# Patient Record
Sex: Female | Born: 1965 | Race: White | Hispanic: No | Marital: Single | Smoking: Never smoker
Health system: Southern US, Community
[De-identification: ages and names within clinical notes are randomized; demographics above are authoritative.]

## PROBLEM LIST (undated history)

## (undated) DIAGNOSIS — T07XXXA Unspecified multiple injuries, initial encounter: Secondary | ICD-10-CM

## (undated) DIAGNOSIS — R011 Cardiac murmur, unspecified: Secondary | ICD-10-CM

## (undated) DIAGNOSIS — L709 Acne, unspecified: Secondary | ICD-10-CM

## (undated) HISTORY — DX: Unspecified multiple injuries, initial encounter: T07.XXXA

## (undated) HISTORY — DX: Cardiac murmur, unspecified: R01.1

## (undated) HISTORY — DX: Acne, unspecified: L70.9

---

## 1993-02-28 HISTORY — PX: TUBAL LIGATION: SHX77

## 2005-08-06 ENCOUNTER — Emergency Department (HOSPITAL_COMMUNITY): Admission: EM | Admit: 2005-08-06 | Discharge: 2005-08-06 | Payer: Self-pay | Admitting: Emergency Medicine

## 2005-08-17 ENCOUNTER — Other Ambulatory Visit: Admission: RE | Admit: 2005-08-17 | Discharge: 2005-08-17 | Payer: Self-pay | Admitting: Obstetrics and Gynecology

## 2005-10-15 ENCOUNTER — Emergency Department (HOSPITAL_COMMUNITY): Admission: EM | Admit: 2005-10-15 | Discharge: 2005-10-15 | Payer: Self-pay | Admitting: Emergency Medicine

## 2006-10-16 ENCOUNTER — Other Ambulatory Visit: Admission: RE | Admit: 2006-10-16 | Discharge: 2006-10-16 | Payer: Self-pay | Admitting: Gynecology

## 2007-07-30 ENCOUNTER — Other Ambulatory Visit: Admission: RE | Admit: 2007-07-30 | Discharge: 2007-07-30 | Payer: Self-pay | Admitting: Gynecology

## 2008-11-09 ENCOUNTER — Emergency Department (HOSPITAL_COMMUNITY): Admission: EM | Admit: 2008-11-09 | Discharge: 2008-11-09 | Payer: Self-pay | Admitting: Emergency Medicine

## 2009-04-09 ENCOUNTER — Other Ambulatory Visit: Admission: RE | Admit: 2009-04-09 | Discharge: 2009-04-09 | Payer: Self-pay | Admitting: Gynecology

## 2009-04-09 ENCOUNTER — Ambulatory Visit: Payer: Self-pay | Admitting: Women's Health

## 2009-10-07 ENCOUNTER — Emergency Department (HOSPITAL_COMMUNITY): Admission: EM | Admit: 2009-10-07 | Discharge: 2009-10-07 | Payer: Self-pay | Admitting: Emergency Medicine

## 2009-10-30 ENCOUNTER — Ambulatory Visit: Payer: Self-pay | Admitting: Women's Health

## 2010-05-14 LAB — URINALYSIS, ROUTINE W REFLEX MICROSCOPIC
Ketones, ur: 15 mg/dL — AB
Protein, ur: 100 mg/dL — AB
Urobilinogen, UA: 0.2 mg/dL (ref 0.0–1.0)

## 2010-05-14 LAB — URINE MICROSCOPIC-ADD ON

## 2010-05-14 LAB — PREGNANCY, URINE: Preg Test, Ur: NEGATIVE

## 2010-10-01 ENCOUNTER — Encounter: Payer: Self-pay | Admitting: Women's Health

## 2010-10-01 ENCOUNTER — Other Ambulatory Visit (HOSPITAL_COMMUNITY)
Admission: RE | Admit: 2010-10-01 | Discharge: 2010-10-01 | Disposition: A | Discharge status: Home / Self Care | Payer: BC Managed Care – PPO | Source: Ambulatory Visit | Attending: Women's Health | Admitting: Women's Health

## 2010-10-01 ENCOUNTER — Ambulatory Visit (INDEPENDENT_AMBULATORY_CARE_PROVIDER_SITE_OTHER): Payer: BC Managed Care – PPO | Admitting: Women's Health

## 2010-10-01 VITALS — BP 100/60 | Ht 71.5 in | Wt 157.0 lb

## 2010-10-01 DIAGNOSIS — R823 Hemoglobinuria: Secondary | ICD-10-CM

## 2010-10-01 DIAGNOSIS — Z01419 Encounter for gynecological examination (general) (routine) without abnormal findings: Secondary | ICD-10-CM

## 2010-10-01 DIAGNOSIS — Z833 Family history of diabetes mellitus: Secondary | ICD-10-CM

## 2010-10-01 NOTE — Progress Notes (Signed)
Diana Vazquez 1965/04/29 [id number]    History:    The patient presents for annual exam.  45 year old single female with monthly 4 day cycles without problems/ BTL. She is in a new relationship, and has had a negative STD screening. She has occasional urinary frequency.   Past medical history, past surgical history, family history and social history were all reviewed and documented in the EPIC chart.   ROS:  A 14 point ROS was performed and pertinent positives and negatives are included in the history.  Exam:  Filed Vitals:   10/01/10 1357  BP: 100/60    General appearance:  Normal Head/Neck:  Normal, without cervical or supraclavicular adenopathy. Thyroid:  Symmetrical, normal in size, without palpable masses or nodularity. Respiratory  Effort:  Normal  Auscultation:  Clear without wheezing or rhonchi Cardiovascular  Auscultation:  Regular rate, without rubs, murmurs or gallops  Edema/varicosities:  Not grossly evident Abdominal  Masses/tenderness:  Soft,nontender, without masses, guarding or rebound.  Liver/spleen:  No organomegaly noted  Hernia:  None appreciated  Occult test:   Skin  Inspection:  Grossly normal  Palpation:  Grossly normal Neurologic/psychiatric  Orientation:  Normal with appropriate conversation.  Mood/affect:  Normal  Genitourinary    Breasts: Examined lying and sitting.     Right: Without masses, retractions, discharge or axillary adenopathy.     Left: Without masses, retractions, discharge or axillary adenopathy.   Inguinal/mons:  Normal without inguinal adenopathy  External genitalia:  Normal  BUS/Urethra/Skene's glands:  Normal  Bladder:  Normal  Vagina:  Normal  Cervix:  normal  Uterus:  normal in size, shape and contour.  Midline and mobile  Adnexa/parametria:     Rt: Without masses or tenderness.   Lt: Without masses or tenderness.  Anus and perineum: Normal  Digital rectal exam: Normal sphincter tone without palpated masses or  tenderness  Assessment/Plan:  45 y.o. year old female for annual exam.  SBE is, exercise, multivitamin daily encouraged. She does have a healthy lifestyle and will continue. Will check a CBC glucose UA and Pap. She had a normal mammogram in 2009, has not had one since. Reviewed the importance of an annual screening after age 63. Breast center number was given she will get scheduled. UTI prevention discussed. Encouraged increase fluids and cranberry juice daily. One year recall her as needed    Harrington Challenger MD, 2:40 PM 10/01/2010

## 2010-10-04 ENCOUNTER — Telehealth: Payer: Self-pay | Admitting: Women's Health

## 2010-10-04 NOTE — Telephone Encounter (Signed)
Telephone call to review labs. Random glucose was 116, her mother is a diabetic. Did review importance of exercise, decreasing simple sugars in her diet. Hemoglobin was 12.1 and hematocrit was 34.9 did review hematocrit slightly low. Encouraged to take a multivitamin with iron, and increase iron rich foods. States has always had  some problems with anemia. Instructed to check a fasting glucose at her mothers home and call with results.

## 2011-02-14 ENCOUNTER — Other Ambulatory Visit: Payer: Self-pay | Admitting: *Deleted

## 2011-02-14 MED ORDER — ACYCLOVIR 200 MG PO CAPS: 200.0000 mg | ORAL_CAPSULE | Freq: Four times a day (QID) | ORAL | Status: AC | PRN

## 2011-07-29 ENCOUNTER — Other Ambulatory Visit: Payer: Self-pay | Admitting: *Deleted

## 2011-07-29 MED ORDER — ACYCLOVIR 200 MG PO CAPS: 200.0000 mg | ORAL_CAPSULE | ORAL | Status: AC

## 2011-12-05 ENCOUNTER — Encounter (INDEPENDENT_AMBULATORY_CARE_PROVIDER_SITE_OTHER): Payer: Self-pay | Admitting: Internal Medicine

## 2011-12-05 ENCOUNTER — Ambulatory Visit (INDEPENDENT_AMBULATORY_CARE_PROVIDER_SITE_OTHER): Payer: No Typology Code available for payment source | Admitting: Internal Medicine

## 2011-12-05 VITALS — BP 110/70 | HR 63 | Temp 97.7°F | Ht 71.0 in | Wt 154.4 lb

## 2011-12-05 NOTE — Progress Notes (Signed)
Subjective:       Patient ID: Danielle Harrell is a 46 y.o. female.    HPI    Danielle Harrell is a 46 y.o. female presenting today as a new patient.  She is new to me and to the practice.  She presents today for an annual physical.  She states that she moved here from [facility] and wanted to establish care. She has not had any blood work for a while and would like to do that.     She states that she has been feeling that her her left eyebrow has been thinning and was hoping to see someone for that.  Other than that, she has been doing well overall and has no acute complaints.  Denies chest pain, nausea, vomiting, fever or chills.      She states that she does her pap smears and mammograms with her GYN.      Past Medical History   Diagnosis Date   . Fractures      History reviewed. No pertinent past surgical history.  Family History   Problem Relation Age of Onset   . Diabetes Mother    . Kidney disease Paternal Grandmother    . Diabetes Paternal Grandmother      History     Social History   . Marital Status: Single     Spouse Name: N/A     Number of Children: N/A   . Years of Education: N/A     Occupational History   . Not on file.     Social History Main Topics   . Smoking status: Never Smoker    . Smokeless tobacco: Not on file   . Alcohol Use: No   . Drug Use: No   . Sexually Active: No     Other Topics Concern   . Not on file     Social History Narrative   . No narrative on file     No Known Allergies  No current outpatient prescriptions on file.           Review of Systems   Constitutional: Negative for fever, chills, diaphoresis, appetite change, fatigue and unexpected weight change.   HENT: Negative for ear pain, congestion, sore throat, rhinorrhea, trouble swallowing, neck pain, postnasal drip, sinus pressure, tinnitus and ear discharge.    Eyes: Negative for photophobia, pain, redness and visual disturbance.   Respiratory: Negative for cough, choking, chest tightness, shortness of breath, wheezing and  stridor.    Cardiovascular: Negative for chest pain, palpitations and leg swelling.   Gastrointestinal: Negative for nausea, vomiting, abdominal pain, diarrhea, blood in stool, abdominal distention and anal bleeding.   Genitourinary: Negative for dysuria, urgency, frequency, hematuria, flank pain and vaginal pain.   Musculoskeletal: Negative for back pain, joint swelling and gait problem.   Skin: Negative for pallor and rash.        Hair loss of left eyebrow   Neurological: Negative for tremors, seizures, syncope, facial asymmetry, speech difficulty, weakness, light-headedness, numbness and headaches.   Psychiatric/Behavioral: Negative for suicidal ideas, hallucinations, behavioral problems, confusion, sleep disturbance, self-injury, dysphoric mood, decreased concentration and agitation. The patient is not nervous/anxious and is not hyperactive.            Objective:    Physical Exam   Constitutional: She is oriented to person, place, and time. She appears well-developed and well-nourished. No distress.   HENT:   Right Ear: External ear normal.   Left Ear: External ear normal.  Mouth/Throat: No oropharyngeal exudate.   Eyes: Conjunctivae normal and EOM are normal. Right eye exhibits no discharge. Left eye exhibits no discharge. No scleral icterus.   Neck: Normal range of motion. Neck supple.   Cardiovascular: Normal rate and regular rhythm.  Exam reveals no gallop.    No murmur heard.  Pulmonary/Chest: Effort normal and breath sounds normal.   Abdominal: Soft. Bowel sounds are normal. She exhibits no distension and no mass. There is no tenderness. There is no rebound and no guarding.   Musculoskeletal: Normal range of motion.   Neurological: She is alert and oriented to person, place, and time.   Skin: Skin is warm. No rash noted. She is not diaphoretic. No erythema.        Hair loss of left eyebrow  Generalized skin lesions   Psychiatric: She has a normal mood and affect. Her behavior is normal. Judgment and  thought content normal.           Assessment:         Danielle Harrell was seen today for annual exam.    Diagnoses and associated orders for this visit:    Routine general medical examination at a health care facility:  Overall doing well and will check the following.  - Basic Metabolic Panel  - CBC without differential  - TSH  - Lipid panel  - Hepatic function panel (LFT)    Hair loss mainly of left eyebrow: Check TSH and refer to dermatology    Generalized skin lesions:  Refer to dermatology     Lifestyle    Dental: 4 months ago, another one today    ,  Floss: yes       Exercise habits: 30 minutes a day     Caffeine intake: coffee a day.      Immunizations.    Tdap. Less than 7 years ago      Preventative: per patient she likes to do it with her GYN.      PAP SMEAR:  06/2010, appt next week with gyn  MAMMOGRAM: will do it with her GYN.  Monthly breast exam:  Will start.      Body mass index is 21.53 kg/(m^2).  Discussed the patient's BMI with her and advised on healthy lifestyle and nutritional guidelines.    Patient is alert and oriented x 3. Understands risks and benefits of current medications and management.  Accepts the current medications and management with and possible risks.  Patient asked appropriate questions which were answered.  She knows she can call if any questions or concerns.

## 2011-12-05 NOTE — Patient Instructions (Signed)
Please see Dermatologist    Dr. Ainsley Spinner or Dr. Les Pou.    [address]  [place] [address]  [phone]

## 2011-12-06 LAB — LIPID PANEL
Cholesterol / HDL Ratio: 3.2 ratio units (ref 0.0–4.4)
Cholesterol: 149 mg/dL (ref 100–199)
HDL: 46 mg/dL (ref >39)
LDL Calculated: 94 mg/dL (ref 0–99)
Triglycerides: 47 mg/dL (ref 0–149)
VLDL Calculated: 9 mg/dL (ref 5–40)

## 2011-12-06 LAB — BASIC METABOLIC PANEL
BUN / Creatinine Ratio: 14 (ref 9–23)
BUN: 12 mg/dL (ref 6–24)
CO2: 20 mmol/L (ref 19–28)
Calcium: 8.8 mg/dL (ref 8.7–10.2)
Chloride: 104 mmol/L (ref 97–108)
Creatinine: 0.87 mg/dL (ref 0.57–1.00)
EGFR: 82 mL/min/{1.73_m2} (ref >59)
EGFR: 94 mL/min/{1.73_m2} (ref >59)
Glucose: 65 mg/dL (ref 65–99)
Potassium: 4 mmol/L (ref 3.5–5.2)
Sodium: 139 mmol/L (ref 134–144)

## 2011-12-06 LAB — HEPATIC FUNCTION PANEL
ALT: 13 IU/L (ref 0–32)
AST (SGOT): 17 IU/L (ref 0–40)
Albumin: 4.2 g/dL (ref 3.5–5.5)
Alkaline Phosphatase: 67 IU/L (ref 42–107)
Bilirubin Direct: 0.13 mg/dL (ref 0.00–0.40)
Bilirubin, Total: 0.6 mg/dL (ref 0.0–1.2)
Protein, Total: 7.2 g/dL (ref 6.0–8.5)

## 2011-12-06 LAB — CBC
Hematocrit: 36.9 % (ref 34.0–46.6)
Hemoglobin: 12.2 g/dL (ref 11.1–15.9)
MCH: 31.5 pg (ref 26.6–33.0)
MCHC: 33.1 g/dL (ref 31.5–35.7)
MCV: 95 fL (ref 79–97)
Platelets: 221 10*3/uL (ref 155–379)
RBC: 3.87 x10E6/uL (ref 3.77–5.28)
RDW: 13.6 % (ref 12.3–15.4)
WBC: 6.6 10*3/uL (ref 3.4–10.8)

## 2011-12-06 LAB — TSH: TSH: 3.51 u[IU]/mL (ref 0.450–4.500)

## 2011-12-12 ENCOUNTER — Encounter: Payer: BC Managed Care – PPO | Admitting: Women's Health

## 2011-12-14 ENCOUNTER — Ambulatory Visit (INDEPENDENT_AMBULATORY_CARE_PROVIDER_SITE_OTHER): Payer: 59 | Admitting: Women's Health

## 2011-12-14 ENCOUNTER — Encounter: Payer: Self-pay | Admitting: Women's Health

## 2011-12-14 VITALS — BP 120/78 | Ht 71.0 in | Wt 157.0 lb

## 2011-12-14 DIAGNOSIS — Z01419 Encounter for gynecological examination (general) (routine) without abnormal findings: Secondary | ICD-10-CM

## 2011-12-14 DIAGNOSIS — B009 Herpesviral infection, unspecified: Secondary | ICD-10-CM

## 2011-12-14 DIAGNOSIS — A609 Anogenital herpesviral infection, unspecified: Secondary | ICD-10-CM

## 2011-12-14 MED ORDER — ACYCLOVIR 200 MG PO CAPS: 200.0000 mg | ORAL_CAPSULE | ORAL | Status: DC

## 2011-12-14 NOTE — Patient Instructions (Addendum)
Vit D 1000 daily Schedule mammogram Health Maintenance, Females A healthy lifestyle and preventative care can promote health and wellness.  Maintain regular health, dental, and eye exams.  Eat a healthy diet. Foods like vegetables, fruits, whole grains, low-fat dairy products, and lean protein foods contain the nutrients you need without too many calories. Decrease your intake of foods high in solid fats, added sugars, and salt. Get information about a proper diet from your caregiver, if necessary.  Regular physical exercise is one of the most important things you can do for your health. Most adults should get at least 150 minutes of moderate-intensity exercise (any activity that increases your heart rate and causes you to sweat) each week. In addition, most adults need muscle-strengthening exercises on 2 or more days a week.   Maintain a healthy weight. The body mass index (BMI) is a screening tool to identify possible weight problems. It provides an estimate of body fat based on height and weight. Your caregiver can help determine your BMI, and can help you achieve or maintain a healthy weight. For adults 20 years and older:  A BMI below 18.5 is considered underweight.  A BMI of 18.5 to 24.9 is normal.  A BMI of 25 to 29.9 is considered overweight.  A BMI of 30 and above is considered obese.  Maintain normal blood lipids and cholesterol by exercising and minimizing your intake of saturated fat. Eat a balanced diet with plenty of fruits and vegetables. Blood tests for lipids and cholesterol should begin at age 86 and be repeated every 5 years. If your lipid or cholesterol levels are high, you are over 50, or you are a high risk for heart disease, you may need your cholesterol levels checked more frequently.Ongoing high lipid and cholesterol levels should be treated with medicines if diet and exercise are not effective.  If you smoke, find out from your caregiver how to quit. If you do not  use tobacco, do not start.  If you are pregnant, do not drink alcohol. If you are breastfeeding, be very cautious about drinking alcohol. If you are not pregnant and choose to drink alcohol, do not exceed 1 drink per day. One drink is considered to be 12 ounces (355 mL) of beer, 5 ounces (148 mL) of wine, or 1.5 ounces (44 mL) of liquor.  Avoid use of street drugs. Do not share needles with anyone. Ask for help if you need support or instructions about stopping the use of drugs.  High blood pressure causes heart disease and increases the risk of stroke. Blood pressure should be checked at least every 1 to 2 years. Ongoing high blood pressure should be treated with medicines, if weight loss and exercise are not effective.  If you are 68 to 44 years old, ask your caregiver if you should take aspirin to prevent strokes.  Diabetes screening involves taking a blood sample to check your fasting blood sugar level. This should be done once every 3 years, after age 16, if you are within normal weight and without risk factors for diabetes. Testing should be considered at a younger age or be carried out more frequently if you are overweight and have at least 1 risk factor for diabetes.  Breast cancer screening is essential preventative care for women. You should practice "breast self-awareness." This means understanding the normal appearance and feel of your breasts and may include breast self-examination. Any changes detected, no matter how small, should be reported to a caregiver. Women in  their 74s and 30s should have a clinical breast exam (CBE) by a caregiver as part of a regular health exam every 1 to 3 years. After age 30, women should have a CBE every year. Starting at age 32, women should consider having a mammogram (breast X-ray) every year. Women who have a family history of breast cancer should talk to their caregiver about genetic screening. Women at a high risk of breast cancer should talk to their  caregiver about having an MRI and a mammogram every year.  The Pap test is a screening test for cervical cancer. Women should have a Pap test starting at age 50. Between ages 66 and 80, Pap tests should be repeated every 2 years. Beginning at age 64, you should have a Pap test every 3 years as long as the past 3 Pap tests have been normal. If you had a hysterectomy for a problem that was not cancer or a condition that could lead to cancer, then you no longer need Pap tests. If you are between ages 52 and 53, and you have had normal Pap tests going back 10 years, you no longer need Pap tests. If you have had past treatment for cervical cancer or a condition that could lead to cancer, you need Pap tests and screening for cancer for at least 20 years after your treatment. If Pap tests have been discontinued, risk factors (such as a new sexual partner) need to be reassessed to determine if screening should be resumed. Some women have medical problems that increase the chance of getting cervical cancer. In these cases, your caregiver may recommend more frequent screening and Pap tests.  The human papillomavirus (HPV) test is an additional test that may be used for cervical cancer screening. The HPV test looks for the virus that can cause the cell changes on the cervix. The cells collected during the Pap test can be tested for HPV. The HPV test could be used to screen women aged 33 years and older, and should be used in women of any age who have unclear Pap test results. After the age of 52, women should have HPV testing at the same frequency as a Pap test.  Colorectal cancer can be detected and often prevented. Most routine colorectal cancer screening begins at the age of 32 and continues through age 29. However, your caregiver may recommend screening at an earlier age if you have risk factors for colon cancer. On a yearly basis, your caregiver may provide home test kits to check for hidden blood in the stool. Use  of a small camera at the end of a tube, to directly examine the colon (sigmoidoscopy or colonoscopy), can detect the earliest forms of colorectal cancer. Talk to your caregiver about this at age 58, when routine screening begins. Direct examination of the colon should be repeated every 5 to 10 years through age 15, unless early forms of pre-cancerous polyps or small growths are found.  Hepatitis C blood testing is recommended for all people born from [birth year] through 1963 and any individual with known risks for hepatitis C.  Practice safe sex. Use condoms and avoid high-risk sexual practices to reduce the spread of sexually transmitted infections (STIs). Sexually active women aged 37 and younger should be checked for Chlamydia, which is a common sexually transmitted infection. Older women with new or multiple partners should also be tested for Chlamydia. Testing for other STIs is recommended if you are sexually active and at increased risk.  Osteoporosis  is a disease in which the bones lose minerals and strength with aging. This can result in serious bone fractures. The risk of osteoporosis can be identified using a bone density scan. Women ages 58 and over and women at risk for fractures or osteoporosis should discuss screening with their caregivers. Ask your caregiver whether you should be taking a calcium supplement or vitamin D to reduce the rate of osteoporosis.  Menopause can be associated with physical symptoms and risks. Hormone replacement therapy is available to decrease symptoms and risks. You should talk to your caregiver about whether hormone replacement therapy is right for you.  Use sunscreen with a sun protection factor (SPF) of 30 or greater. Apply sunscreen liberally and repeatedly throughout the day. You should seek shade when your shadow is shorter than you. Protect yourself by wearing long sleeves, pants, a wide-brimmed hat, and sunglasses year round, whenever you are outdoors.  Notify  your caregiver of new moles or changes in moles, especially if there is a change in shape or color. Also notify your caregiver if a mole is larger than the size of a pencil eraser.  Stay current with your immunizations. Document Released: 08/30/2010 Document Revised: 05/09/2011 Document Reviewed: 08/30/2010 [facility] Patient Information 2013 [place], [place].

## 2011-12-14 NOTE — Progress Notes (Signed)
Diana Vazquez 1965-07-11 [id number]    History:    The patient presents for annual exam.  Regular monthly 4-5d cycle history of BTL. Had normal labs of CBC, TSH, lipid panel and blood sugar at primary care. Has had left eyebrow alopecia unknown reason. Not sexually active. History of rare HSV, uses  Acyclovir prn. History of normal Paps, normal mammogram 2009.   Past medical history, past surgical history, family history and social history were all reviewed and documented in the EPIC chart. Has moved to [place] works for [employer].   ROS:  A  ROS was performed and pertinent positives and negatives are included in the history.  Exam:  Filed Vitals:   12/14/11 0806  BP: 120/78    General appearance:  Mild scoliosis  Head/Neck:  Normal, without cervical or supraclavicular adenopathy. Thyroid:  Symmetrical, normal in size, without palpable masses or nodularity. Respiratory  Effort:  Normal  Auscultation:  Clear without wheezing or rhonchi Cardiovascular  Auscultation:  Regular rate, without rubs, murmurs or gallops  Edema/varicosities:  Not grossly evident Abdominal  Soft,nontender, without masses, guarding or rebound.  Liver/spleen:  No organomegaly noted  Hernia:  None appreciated  Skin  Inspection:  Grossly normal/left eyebrow mid section  moderate alopecia  Palpation:  Grossly normal Neurologic/psychiatric  Orientation:  Normal with appropriate conversation.  Mood/affect:  Normal  Genitourinary    Breasts: Examined lying and sitting.     Right: Without masses, retractions, discharge or axillary adenopathy.     Left: Without masses, retractions, discharge or axillary adenopathy.   Inguinal/mons:  Normal without inguinal adenopathy  External genitalia:  Normal  BUS/Urethra/Skene's glands:  Normal  Bladder:  Normal  Vagina:  Normal  Cervix:  Normal  Uterus:   normal in size, shape and contour.  Midline and mobile  Adnexa/parametria:     Rt: Without masses or  tenderness.   Lt: Without masses or tenderness.  Anus and perineum: Normal  Digital rectal exam: Normal sphincter tone without palpated masses or tenderness  Assessment/Plan:  46 y.o. S. WF G0  for annual exam with complaint eyebrow alopecia.  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 SBE's, exercise, calcium rich diet, vitamin D 1000 daily encouraged. Uses  Loestrin 1/20 for cycle control for travel one-2 months per year, prescription, proper use, slight risk for blood clots and strokes reviewed. No labs, reports normal labs at primary care. Reviewed dermatologist might be helpful for eyebrow alopecia. No Pap history of normal Pap 2012 new screening guidelines reviewed. Acyclovir 200 4 times daily, prescription, proper use given.    Harrington Challenger WHNP, 2:12 PM 12/14/2011

## 2012-12-26 ENCOUNTER — Encounter (INDEPENDENT_AMBULATORY_CARE_PROVIDER_SITE_OTHER): Payer: Self-pay | Admitting: Internal Medicine

## 2012-12-26 ENCOUNTER — Ambulatory Visit (INDEPENDENT_AMBULATORY_CARE_PROVIDER_SITE_OTHER): Payer: No Typology Code available for payment source | Admitting: Internal Medicine

## 2012-12-26 VITALS — BP 111/72 | HR 77 | Temp 98.4°F | Ht 71.0 in | Wt 157.0 lb

## 2012-12-26 NOTE — Patient Instructions (Signed)
Please see Dermatologist    Please see Dermatologist    Dr. Clide Dales or Dr. Les Pou  [address]  [place] [address]  [phone]    Please see your GYN and assure to do your mammogram

## 2012-12-26 NOTE — Progress Notes (Signed)
Subjective:       Patient ID: Danielle Harrell is a 47 y.o. female.    HPI    Danielle Harrell is a 47 y.o. female presents for an annual physical.  She has been doing well without any problems.   She is here for annual blood work.   Denies any complaints.  Denies any chest pain,nausea, vomiting, fever or chills.    Past Medical History   Diagnosis Date   . Fractures      No past surgical history on file.  Family History   Problem Relation Age of Onset   . Diabetes Mother    . Kidney disease Paternal Grandmother    . Diabetes Paternal Grandmother      History     Social History   . Marital Status: Single     Spouse Name: N/A     Number of Children: N/A   . Years of Education: N/A     Occupational History   . Not on file.     Social History Main Topics   . Smoking status: Never Smoker    . Smokeless tobacco: Not on file   . Alcohol Use: No   . Drug Use: No   . Sexually Active: No     Other Topics Concern   . Not on file     Social History Narrative   . No narrative on file     No Known Allergies  No current outpatient prescriptions on file.       GYN HISTORY  LMP: 3 weeks ago   Number of pregnancies:0  Abortions:0  Miscarriages.0  Type of delivery.n/a  Last PAP SMEAR: 11/2011  Last Breast Exam : 11/2011      Review of Systems   Constitutional: Negative for fever, chills, diaphoresis, appetite change, fatigue and unexpected weight change.   HENT: Negative for congestion, facial swelling, sinus pressure, sneezing, trouble swallowing and voice change.    Eyes: Negative for photophobia and visual disturbance.   Respiratory: Negative for cough, choking, chest tightness, shortness of breath and wheezing.    Cardiovascular: Negative for chest pain, palpitations and leg swelling.   Gastrointestinal: Negative for nausea, vomiting, abdominal pain, diarrhea and blood in stool.   Genitourinary: Negative for dysuria, urgency, frequency and hematuria.   Musculoskeletal: Negative for back pain, gait problem, joint swelling, myalgias,  neck pain and neck stiffness.   Skin: Negative for pallor and rash.   Neurological: Negative for dizziness, tremors, seizures, syncope, facial asymmetry, speech difficulty, weakness, light-headedness, numbness and headaches.   Psychiatric/Behavioral: Negative for suicidal ideas, hallucinations, behavioral problems, confusion, sleep disturbance, self-injury, dysphoric mood, decreased concentration and agitation. The patient is not nervous/anxious and is not hyperactive.            Objective:    Physical Exam   Constitutional: She is oriented to person, place, and time. She appears well-developed and well-nourished. No distress.   HENT:   Mouth/Throat: No oropharyngeal exudate.   Eyes: No scleral icterus.   Neck: Neck supple.   Cardiovascular: Normal rate and regular rhythm.    Pulmonary/Chest: Effort normal and breath sounds normal. No respiratory distress. She has no wheezes.   Abdominal: Soft. Bowel sounds are normal. She exhibits no distension. There is no tenderness.   Musculoskeletal: Normal range of motion.   Neurological: She is alert and oriented to person, place, and time.   Skin: Skin is warm. No rash noted. She is not diaphoretic. No erythema.  Generalized skin lesions   Psychiatric: She has a normal mood and affect. Her behavior is normal. Judgment and thought content normal.           Assessment:         Danielle Harrell was seen today for annual exam.    Diagnoses and associated orders for this visit:    Routine general medical examination at a health care facility:  Overall doing well and will check the following.  She declines HIV and vitamin D testing.     - Basic Metabolic Panel  - CBC without differential  - TSH  - Lipid panel  - Hepatic function panel (LFT)      Generalized skin lesions:  Refer to dermatology.  Number given and she will make an appointment.    Lifestyle   Dental: 2 days ago  Floss: yes   Exercise habits: 30 minutes a day   Caffeine intake: 1 cup coffee a day.     Immunizations.   Tdap.  Less than 8 years ago     Preventative: per patient she likes to do it with her GYN.   PAP SMEAR: 11/2011, appt in two weeks with gyn   MAMMOGRAM: would like to do it with her GYN. Understands the importance of this and consequences of procrastination.  Monthly breast exam: yes       Patient is alert and oriented x 3. Understands risks and benefits of current medications and management.  Accepts the current medications and management with and possible risks.  Patient asked appropriate questions which were answered.  She knows she can call if any questions or concerns.

## 2012-12-27 LAB — BASIC METABOLIC PANEL
BUN / Creatinine Ratio: 19 (ref 9–23)
BUN: 15 mg/dL (ref 6–24)
CO2: 22 mmol/L (ref 18–29)
Calcium: 9 mg/dL (ref 8.7–10.2)
Chloride: 102 mmol/L (ref 97–108)
Creatinine: 0.78 mg/dL (ref 0.57–1.00)
EGFR: 106 mL/min/{1.73_m2} (ref >59)
EGFR: 92 mL/min/{1.73_m2} (ref >59)
Glucose: 81 mg/dL (ref 65–99)
Potassium: 4.5 mmol/L (ref 3.5–5.2)
Sodium: 140 mmol/L (ref 134–144)

## 2012-12-27 LAB — HEPATIC FUNCTION PANEL
ALT: 11 IU/L (ref 0–32)
AST (SGOT): 14 IU/L (ref 0–40)
Albumin: 4 g/dL (ref 3.5–5.5)
Alkaline Phosphatase: 74 IU/L (ref 39–117)
Bilirubin Direct: 0.11 mg/dL (ref 0.00–0.40)
Bilirubin, Total: 0.4 mg/dL (ref 0.0–1.2)
Protein, Total: 6.7 g/dL (ref 6.0–8.5)

## 2012-12-27 LAB — LIPID PANEL
Cholesterol / HDL Ratio: 3.7 ratio units (ref 0.0–4.4)
Cholesterol: 162 mg/dL (ref 100–199)
HDL: 44 mg/dL (ref >39)
LDL Calculated: 102 mg/dL — ABNORMAL HIGH (ref 0–99)
Triglycerides: 80 mg/dL (ref 0–149)
VLDL Calculated: 16 mg/dL (ref 5–40)

## 2012-12-27 LAB — CBC
Hematocrit: 37.7 % (ref 34.0–46.6)
Hemoglobin: 12.4 g/dL (ref 11.1–15.9)
MCH: 30.8 pg (ref 26.6–33.0)
MCHC: 32.9 g/dL (ref 31.5–35.7)
MCV: 94 fL (ref 79–97)
Platelets: 237 10*3/uL (ref 150–379)
RBC: 4.02 x10E6/uL (ref 3.77–5.28)
RDW: 13.6 % (ref 12.3–15.4)
WBC: 5.8 10*3/uL (ref 3.4–10.8)

## 2012-12-27 LAB — TSH: TSH: 3.31 u[IU]/mL (ref 0.450–4.500)

## 2013-01-02 ENCOUNTER — Ambulatory Visit (INDEPENDENT_AMBULATORY_CARE_PROVIDER_SITE_OTHER): Payer: 59 | Admitting: Women's Health

## 2013-01-02 ENCOUNTER — Encounter: Payer: Self-pay | Admitting: Women's Health

## 2013-01-02 ENCOUNTER — Other Ambulatory Visit (HOSPITAL_COMMUNITY)
Admission: RE | Admit: 2013-01-02 | Discharge: 2013-01-02 | Disposition: A | Discharge status: Home / Self Care | Payer: 59 | Source: Ambulatory Visit | Attending: Gynecology | Admitting: Gynecology

## 2013-01-02 VITALS — BP 108/70 | Ht 71.0 in | Wt 158.0 lb

## 2013-01-02 DIAGNOSIS — Z01419 Encounter for gynecological examination (general) (routine) without abnormal findings: Secondary | ICD-10-CM | POA: Insufficient documentation

## 2013-01-02 DIAGNOSIS — B009 Herpesviral infection, unspecified: Secondary | ICD-10-CM | POA: Insufficient documentation

## 2013-01-02 DIAGNOSIS — Z23 Encounter for immunization: Secondary | ICD-10-CM

## 2013-01-02 DIAGNOSIS — A609 Anogenital herpesviral infection, unspecified: Secondary | ICD-10-CM

## 2013-01-02 MED ORDER — ACYCLOVIR 200 MG PO CAPS: 200.0000 mg | ORAL_CAPSULE | ORAL | Status: DC

## 2013-01-02 NOTE — Progress Notes (Signed)
Patient ID: Diana Vazquez, female   DOB: Aug 16, 1965, 47 y.o.   MRN: [id number] Diana Vazquez Oct 13, 1965 [id number]    History:    The patient presents for annual exam.  Monthly cycles/BTL not currently sexually active. Normal Pap history last mammogram 2009 normal. Labs at primary care reports as normal. 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   Past medical history, past surgical history, family history and social history were all reviewed and documented in the EPIC chart. Works remote for [employer]. Mother diabetes.   ROS:  A  ROS was performed and pertinent positives and negatives are included in the history.  Exam:  Filed Vitals:   01/02/13 0808  BP: 108/70    General appearance:  Normal Head/Neck:  Normal, without cervical or supraclavicular adenopathy. Thyroid:  Symmetrical, normal in size, without palpable masses or nodularity. Respiratory  Effort:  Normal  Auscultation:  Clear without wheezing or rhonchi Cardiovascular  Auscultation:  Regular rate, without rubs, murmurs or gallops  Edema/varicosities:  Not grossly evident Abdominal  Soft,nontender, without masses, guarding or rebound.  Liver/spleen:  No organomegaly noted  Hernia:  None appreciated  Skin  Inspection:  Grossly normal  Palpation:  Grossly normal Neurologic/psychiatric  Orientation:  Normal with appropriate conversation.  Mood/affect:  Normal  Genitourinary    Breasts: Examined lying and sitting.     Right: Without masses, retractions, discharge or axillary adenopathy.     Left: Without masses, retractions, discharge or axillary adenopathy.   Inguinal/mons:  Normal without inguinal adenopathy  External genitalia:  Normal  BUS/Urethra/Skene's glands:  Normal  Bladder:  Normal  Vagina:  Normal  Cervix:  Normal  Uterus:   normal in size, shape and contour.  Midline and mobile  Adnexa/parametria:     Rt: Without masses or tenderness.   Lt: Without masses or  tenderness.  Anus and perineum: Normal  Digital rectal exam: Normal sphincter tone without palpated masses or tenderness  Assessment/Plan:  47 y.o.SWF G0  for annual exam with no complaints.  Normal GYN exam/BTL HSV 1 rare outbreaks  Plan: SBE's, reviewed importance of annual screen, breast center information given and reviewed importance of scheduling. Continue regular exercise, calcium rich diet, vitamin D 1000 daily. Pap. Pap normal 2012, new screening guidelines reviewed. Condoms encouraged if become sexually active.   Diana Vazquez [facility], 8:47 AM 01/02/2013

## 2013-01-02 NOTE — Patient Instructions (Signed)

## 2013-01-02 NOTE — Addendum Note (Signed)
Addended by: Richardson Chiquito on: 01/02/2013 09:04 AM   Modules accepted: Orders

## 2013-01-03 LAB — URINALYSIS W MICROSCOPIC + REFLEX CULTURE
Bacteria, UA: NONE SEEN
Casts: NONE SEEN
Crystals: NONE SEEN
Glucose, UA: NEGATIVE mg/dL
Ketones, ur: NEGATIVE mg/dL
Leukocytes, UA: NEGATIVE
Specific Gravity, Urine: 1.016 (ref 1.005–1.030)
pH: 5.5 (ref 5.0–8.0)

## 2013-01-09 ENCOUNTER — Other Ambulatory Visit: Payer: Self-pay

## 2013-01-09 DIAGNOSIS — Z1231 Encounter for screening mammogram for malignant neoplasm of breast: Secondary | ICD-10-CM

## 2013-02-12 ENCOUNTER — Ambulatory Visit
Admission: RE | Admit: 2013-02-12 | Discharge: 2013-02-12 | Disposition: A | Discharge status: Home / Self Care | Payer: 59 | Source: Ambulatory Visit

## 2013-02-12 ENCOUNTER — Ambulatory Visit: Payer: 59

## 2013-02-12 DIAGNOSIS — Z1231 Encounter for screening mammogram for malignant neoplasm of breast: Secondary | ICD-10-CM

## 2013-06-04 ENCOUNTER — Ambulatory Visit (INDEPENDENT_AMBULATORY_CARE_PROVIDER_SITE_OTHER): Payer: No Typology Code available for payment source | Admitting: Internal Medicine

## 2013-06-04 ENCOUNTER — Encounter (INDEPENDENT_AMBULATORY_CARE_PROVIDER_SITE_OTHER): Payer: Self-pay | Admitting: Internal Medicine

## 2013-06-04 VITALS — BP 103/70 | HR 80 | Temp 97.8°F | Ht 71.0 in | Wt 156.0 lb

## 2013-06-04 DIAGNOSIS — Z0000 Encounter for general adult medical examination without abnormal findings: Secondary | ICD-10-CM

## 2013-06-04 DIAGNOSIS — R5383 Other fatigue: Secondary | ICD-10-CM

## 2013-06-04 NOTE — Progress Notes (Signed)
SUBJECTIVE:   48 y.o. female for annual routine checkup. Last physical exam was in October of 2014. She has been feeling tired and fatigued in the past six months, also feeling cold more then usual. She has no hx of depression. No change in her life style. She denies snoring, sleeps well and at time more than usual.     Review of Systems   Constitutional: Positive for malaise/fatigue. Negative for fever, chills, weight loss and diaphoresis.   HENT: Negative for congestion, ear discharge, ear pain, hearing loss, nosebleeds, sore throat and tinnitus.         Headaches on and off, not associated with any other symptoms, improves with OTC NSAIDs.   Eyes: Negative for blurred vision, double vision, photophobia, pain, discharge and redness.   Respiratory: Negative for cough, hemoptysis, sputum production, shortness of breath, wheezing and stridor.    Cardiovascular: Negative for chest pain, palpitations, orthopnea, claudication, leg swelling and PND.   Gastrointestinal: Negative for heartburn, nausea, vomiting, abdominal pain, diarrhea, constipation, blood in stool and melena.   Genitourinary: Negative for dysuria, urgency, frequency, hematuria and flank pain.   Musculoskeletal: Positive for myalgias. Negative for back pain, falls, joint pain and neck pain.   Skin: Positive for rash. Negative for itching.        At times she has redness and papular lesion over her face. She uses OTC cortisone cream that helps.   Neurological: Positive for headaches. Negative for dizziness, tingling, tremors, sensory change, speech change, focal weakness, seizures, loss of consciousness and weakness.   Endo/Heme/Allergies: Negative for polydipsia. Bruises/bleeds easily.        She reports hx of vitamin K deficiency in the past.    Psychiatric/Behavioral: Negative for depression, suicidal ideas, hallucinations, memory loss and substance abuse. The patient is not nervous/anxious and does not have insomnia.         She reports hx of SAD in  the past.         Past Medical History   Diagnosis Date   . Fractures    . Heart murmur        Current Outpatient Prescriptions   Medication Sig Dispense Refill   . multivitamin (CENTRUM) tablet Take 1 tablet by mouth daily.           Family History   Problem Relation Age of Onset   . Diabetes Mother    . Kidney disease Paternal Grandmother    . Diabetes Paternal Grandmother    . Heart attack Paternal Grandfather    . Cancer Neg Hx    . Stroke Neg Hx        History reviewed. No pertinent past surgical history.    History     Social History   . Marital Status: Single     Spouse Name: N/A     Number of Children: N/A   . Years of Education: N/A     Occupational History   . Not on file.     Social History Main Topics   . Smoking status: Never Smoker    . Smokeless tobacco: Not on file   . Alcohol Use: No   . Drug Use: No   . Sexually Active: No     Other Topics Concern   . Not on file     Social History Narrative   . No narrative on file       Allergies: Review of patient's allergies indicates no known allergies.   Patient's last menstrual period  was 05/14/2013.    Labs reviewed:    Lab Results   Component Value Date    WBC 5.8 12/26/2012    HGB 12.4 12/26/2012    HCT 37.7 12/26/2012    MCV 94 12/26/2012    PLT 237 12/26/2012     Lab Results   Component Value Date    CREAT 0.78 12/26/2012    BUN 15 12/26/2012    NA 140 12/26/2012    K 4.5 12/26/2012    CL 102 12/26/2012    CO2 22 12/26/2012     Lab Results   Component Value Date    CHOL 162 12/26/2012    CHOL 149 12/05/2011     Lab Results   Component Value Date    HDL 44 12/26/2012    HDL 46 12/05/2011     Lab Results   Component Value Date    LDL 102* 12/26/2012    LDL 94 12/05/2011     Lab Results   Component Value Date    TRIG 80 12/26/2012    TRIG 47 12/05/2011     Lab Results   Component Value Date    ALT 11 12/26/2012    AST 14 12/26/2012    ALKPHOS 74 12/26/2012    BILITOTAL 0.4 12/26/2012     Lab Results   Component Value Date    TSH 3.310 12/26/2012          OBJECTIVE:   The patient appears well, alert, oriented x 3, in no distress.  BP 103/70  Pulse 80  Temp 97.8 F (36.6 C) (Oral)  Ht 1.803 m (5\' 11" )  Wt 70.761 kg (156 lb)  BMI 21.77 kg/m2  LMP 05/14/2013  Wt Readings from Last 3 Encounters:   06/04/13 70.761 kg (156 lb)   12/26/12 71.215 kg (157 lb)   12/05/11 70.035 kg (154 lb 6.4 oz)     Skin with no suspicious lesion, dry skin over left cheek area noted. No redness, no papular or vesicular rash.  ENT normal.  Neck supple. No adenopathy or thyromegaly. PERLA. Lungs are clear, good air entry, no wheezes, rhonchi or rales. S1 and S2 normal, no murmurs, regular rate and rhythm. Abdomen soft without tenderness, guarding, mass or organomegaly. Extremities show no edema, normal peripheral pulses. Neurological is normal, no focal findings.    ASSESSMENT:     1. Physical exam, annual    2. Fatigue    3.  Headache      PLAN:   Fasting labs as part of PE.  Causes of fatigue discussed. She denies being depressed, no excessive vaginal bleeding. She eats well and does exercise on regular bases. Fatigue may have been due to SAD. She has hx of SAD. Headaches may be due to spending too many hours behind a computer screening. Will start with checking ESR, ANA, RF, and TSH given hx of fatigue.  Avoid using cortisone over facial rash area. Call when the rash appears so that we can evaluate. Call us back if you have photosensitivity rash.

## 2013-06-04 NOTE — Patient Instructions (Signed)
Protect your bones from Osteoporosis by doing weight bearing exercises on regular bases, taking calcium with vitamin D on daily bases as listed below:  Calcium 1200 mg per day  Vitamin D 600 to 800 IU daily    Please focus on exercising daily for at least 30 minutes.  Avoid high fat, high calorie diet, eat more cold cuts, fresh vegetables, fruits and low salt diet    I will call you back once I have your lab results  Repeat health assessment every one to two years.    Get an eye and dental exam on regular bases  Do not smoke cigars or cigarettes. Do not chew any tobacco products.  Always wear your seatbelt while driving  Make sure your get your flu vaccine yearly unless you have allergies or contraindication to it    Do skin examination on regular bases and if you notice any changes on your current moles please let us know.    ABDC of skin evaluation:    A: Asymmetric lesion  B: Border of lesion changes  C: Color of skin lesion changes  D: Diameter of skin lesion changes     If you notice any of these then please let us know      Colonoscopy starts at age 79 but earlier for those with family history of colon cancer .  Mammogram should be done every one to to year or yearly for those at high risk.        Eating Healthy  Changing the way you eat can reduce many of your risk factors. It can lower your cholesterol, blood pressure, and weight. Your diet doesn't have to be bland and boring to be healthy. Just follow these 3 steps: eat less fat and salt, and eat more fiber. Your whole family can benefit from healthy eating habits.    1. Eat Less Fat   Eat fewer fatty cuts of meat and more fish.   Avoid butter and lard, and use less margarine.   Avoid foods containing palm, coconut, or hydrogenated oils.   Eat fewer high-fat dairy products like cheese, ice cream, and whole milk.   Get a heart-healthy cookbook and try some low-fat recipes.   2.Eat Less Salt   Don't add salt to food when cooking, and keep the  saltshaker off the table.   Don't use high-salt ingredients such as MSG, soy sauce, baking soda, and baking powder.   Instead of salt, season your food with herbs and flavorings such as lemon, garlic, and onion.   3. EatMore Fiber   Eat fresh fruits and vegetables.   Add oats, whole-grain rice, and bran to your diet.   Beans and potatoes are excellent sources of fiber.   When you eat more fiber, be sure to drink more water to prevent constipation.    [address] [place], [place] [address]. All rights reserved. This information is not intended as a substitute for professional medical care. Always follow your healthcare professional's instructions.

## 2013-06-05 LAB — HEPATIC FUNCTION PANEL
ALT: 13 IU/L (ref 0–32)
AST (SGOT): 18 IU/L (ref 0–40)
Albumin: 4.2 g/dL (ref 3.5–5.5)
Alkaline Phosphatase: 64 IU/L (ref 39–117)
Bilirubin Direct: 0.17 mg/dL (ref 0.00–0.40)
Bilirubin, Total: 0.7 mg/dL (ref 0.0–1.2)
Protein, Total: 6.9 g/dL (ref 6.0–8.5)

## 2013-06-05 LAB — RHEUMATOID FACTOR: RA Latex Turbid.: 7.5 IU/mL (ref 0.0–13.9)

## 2013-06-05 LAB — CBC
Hematocrit: 38.5 % (ref 34.0–46.6)
Hemoglobin: 12.6 g/dL (ref 11.1–15.9)
MCH: 30.4 pg (ref 26.6–33.0)
MCHC: 32.7 g/dL (ref 31.5–35.7)
MCV: 93 fL (ref 79–97)
Platelets: 228 10*3/uL (ref 150–379)
RBC: 4.14 x10E6/uL (ref 3.77–5.28)
RDW: 13.7 % (ref 12.3–15.4)
WBC: 5.6 10*3/uL (ref 3.4–10.8)

## 2013-06-05 LAB — BASIC METABOLIC PANEL
BUN / Creatinine Ratio: 13 (ref 9–23)
BUN: 10 mg/dL (ref 6–24)
CO2: 25 mmol/L (ref 18–29)
Calcium: 9.2 mg/dL (ref 8.7–10.2)
Chloride: 101 mmol/L (ref 97–108)
Creatinine: 0.8 mg/dL (ref 0.57–1.00)
EGFR: 103 mL/min/{1.73_m2} (ref >59)
EGFR: 89 mL/min/{1.73_m2} (ref >59)
Glucose: 73 mg/dL (ref 65–99)
Potassium: 4.6 mmol/L (ref 3.5–5.2)
Sodium: 139 mmol/L (ref 134–144)

## 2013-06-05 LAB — LIPID PANEL
Cholesterol / HDL Ratio: 3.5 ratio units (ref 0.0–4.4)
Cholesterol: 166 mg/dL (ref 100–199)
HDL: 48 mg/dL (ref >39)
LDL Calculated: 108 mg/dL — ABNORMAL HIGH (ref 0–99)
Triglycerides: 50 mg/dL (ref 0–149)
VLDL Calculated: 10 mg/dL (ref 5–40)

## 2013-06-05 LAB — SEDIMENTATION RATE: Sed Rate: 3 mm/hr (ref 0–32)

## 2013-06-06 LAB — ANA SCREEN REFLEX: ANA Direct: NEGATIVE

## 2013-06-18 ENCOUNTER — Encounter (INDEPENDENT_AMBULATORY_CARE_PROVIDER_SITE_OTHER): Payer: Self-pay | Admitting: Internal Medicine

## 2013-06-25 ENCOUNTER — Encounter: Payer: 59 | Admitting: Women's Health

## 2013-10-11 ENCOUNTER — Encounter: Payer: Self-pay | Admitting: Women's Health

## 2013-10-11 ENCOUNTER — Ambulatory Visit (INDEPENDENT_AMBULATORY_CARE_PROVIDER_SITE_OTHER): Payer: 59 | Admitting: Women's Health

## 2013-10-11 ENCOUNTER — Telehealth: Payer: Self-pay | Admitting: Women's Health

## 2013-10-11 VITALS — BP 116/74 | Ht 71.0 in | Wt 160.0 lb

## 2013-10-11 DIAGNOSIS — Z01419 Encounter for gynecological examination (general) (routine) without abnormal findings: Secondary | ICD-10-CM

## 2013-10-11 NOTE — Progress Notes (Signed)
Diana Vazquez November 11, 1965 [id number]    History:    Presents for annual exam.  Regular monthly cycle/BTL with no complaints. Normal Pap and mammogram history. HSV 1.  Past medical history, past surgical history, family history and social history were all reviewed and documented in the EPIC chart. Layed off from the [employer] and is now looking for a job. Mother diabetes.  ROS:  A  12 point ROS was performed and pertinent positives and negatives are included.  Exam:  Filed Vitals:   10/11/13 1405  BP: 116/74    General appearance:  Normal Thyroid:  Symmetrical, normal in size, without palpable masses or nodularity. Respiratory  Auscultation:  Clear without wheezing or rhonchi Cardiovascular  Auscultation:  Regular rate, without rubs, murmurs or gallops  Edema/varicosities:  Not grossly evident Abdominal  Soft,nontender, without masses, guarding or rebound.  Liver/spleen:  No organomegaly noted  Hernia:  None appreciated  Skin  Inspection:  Grossly normal   Breasts: Examined lying and sitting.     Right: Without masses, retractions, discharge or axillary adenopathy.     Left: Without masses, retractions, discharge or axillary adenopathy. Gentitourinary   Inguinal/mons:  Normal without inguinal adenopathy  External genitalia:  Normal  BUS/Urethra/Skene's glands:  Normal  Vagina:  Normal  Cervix:  Normal  Uterus:  normal in size, shape and contour.  Midline and mobile  Adnexa/parametria:     Rt: Without masses or tenderness.   Lt: Without masses or tenderness.  Anus and perineum: Normal  Digital rectal exam: Normal sphincter tone without palpated masses or tenderness  Assessment/Plan:  48 y.o. SWF G0 for annual exam.    Regular monthly cycle/BTL HSV 1  Plan: SBE's, continue annual mammogram, 3D tomography encouraged history of dense breast. Condoms encouraged if  sexually active, calcium rich diet, regular exercise, vitamin D 1000 daily encouraged. Labs at  primary care. Normal Pap 2014, new screening guidelines reviewed. Zovirax 200 mg prescription, proper use given and reviewed.   Note: This dictation was prepared with Dragon/digital dictation.  Any transcriptional errors that result are unintentional. Harrington ChallengerYOUNG,Diana Vazquez [name], 5:28 PM 10/11/2013

## 2013-10-11 NOTE — Telephone Encounter (Signed)
10/11/13-Pt request for me to ask [place] if she needed to schedule a bone density yet. Per Harriett SineNancy, she doesn't need to have that done yet. Pt was called with this information/wl

## 2013-10-11 NOTE — Patient Instructions (Signed)

## 2013-10-22 ENCOUNTER — Encounter (INDEPENDENT_AMBULATORY_CARE_PROVIDER_SITE_OTHER): Payer: Self-pay | Admitting: Internal Medicine

## 2013-11-26 ENCOUNTER — Ambulatory Visit (INDEPENDENT_AMBULATORY_CARE_PROVIDER_SITE_OTHER): Payer: 59 | Admitting: Gynecology

## 2013-11-26 DIAGNOSIS — Z23 Encounter for immunization: Secondary | ICD-10-CM

## 2014-06-18 ENCOUNTER — Ambulatory Visit (INDEPENDENT_AMBULATORY_CARE_PROVIDER_SITE_OTHER): Payer: 59 | Admitting: Women's Health

## 2014-06-18 ENCOUNTER — Other Ambulatory Visit (HOSPITAL_COMMUNITY)
Admission: RE | Admit: 2014-06-18 | Discharge: 2014-06-18 | Disposition: A | Discharge status: Home / Self Care | Payer: 59 | Source: Ambulatory Visit | Attending: Women's Health | Admitting: Women's Health

## 2014-06-18 ENCOUNTER — Encounter: Payer: Self-pay | Admitting: Women's Health

## 2014-06-18 VITALS — BP 120/78 | Ht 71.0 in | Wt 161.0 lb

## 2014-06-18 DIAGNOSIS — A609 Anogenital herpesviral infection, unspecified: Secondary | ICD-10-CM | POA: Diagnosis not present

## 2014-06-18 DIAGNOSIS — Z13220 Encounter for screening for lipoid disorders: Secondary | ICD-10-CM | POA: Diagnosis not present

## 2014-06-18 DIAGNOSIS — Z01419 Encounter for gynecological examination (general) (routine) without abnormal findings: Secondary | ICD-10-CM

## 2014-06-18 DIAGNOSIS — R21 Rash and other nonspecific skin eruption: Secondary | ICD-10-CM

## 2014-06-18 DIAGNOSIS — Z833 Family history of diabetes mellitus: Secondary | ICD-10-CM

## 2014-06-18 DIAGNOSIS — Z1151 Encounter for screening for human papillomavirus (HPV): Secondary | ICD-10-CM | POA: Diagnosis present

## 2014-06-18 DIAGNOSIS — N946 Dysmenorrhea, unspecified: Secondary | ICD-10-CM

## 2014-06-18 MED ORDER — ACYCLOVIR 200 MG PO CAPS: 200.0000 mg | ORAL_CAPSULE | ORAL | Status: DC

## 2014-06-18 MED ORDER — IBUPROFEN 600 MG PO TABS: 600.0000 mg | ORAL_TABLET | Freq: Three times a day (TID) | ORAL | Status: DC | PRN

## 2014-06-18 MED ORDER — BETAMETHASONE DIPROPIONATE 0.05 % EX CREA: TOPICAL_CREAM | Freq: Two times a day (BID) | CUTANEOUS | Status: DC

## 2014-06-18 NOTE — Patient Instructions (Signed)

## 2014-06-18 NOTE — Progress Notes (Signed)
Rosario AdieLaura Rodick [birth year] [id number]    History:    Presents for annual exam.  Monthly cycle,  several episodes of skipping 1-2 months in the past year, during that time increased joint achiness, none today. BTL. Not sexually active. Normal Pap and mammogram history. HSV-1 rare outbreaks.  Past medical history, past surgical history, family history and social history were all reviewed and documented in the EPIC chart. Recently laid off from a job. Mother diabetes.  ROS:  A ROS was performed and pertinent positives and negatives are included.  Exam:  Filed Vitals:   06/18/14 1417  BP: 120/78    General appearance:  Normal/mild scoliosis Thyroid:  Symmetrical, normal in size, without palpable masses or nodularity. Respiratory  Auscultation:  Clear without wheezing or rhonchi Cardiovascular  Auscultation:  Regular rate, without rubs, murmurs or gallops  Edema/varicosities:  Not grossly evident Abdominal  Soft,nontender, without masses, guarding or rebound.  Liver/spleen:  No organomegaly noted  Hernia:  None appreciated  Skin  Inspection:  Grossly normal   Breasts: Examined lying and sitting.     Right: Without masses, retractions, discharge or axillary adenopathy.     Left: Without masses, retractions, discharge or axillary adenopathy. Gentitourinary   Inguinal/mons:  Normal without inguinal adenopathy  External genitalia:  Normal  BUS/Urethra/Skene's glands:  Normal  Vagina:  Normal  Cervix:  Normal  Uterus:   normal in size, shape and contour.  Midline and mobile  Adnexa/parametria:     Rt: Without masses or tenderness.   Lt: Without masses or tenderness.  Anus and perineum: Normal  Digital rectal exam: Normal sphincter tone without palpated masses or tenderness  Assessment/Plan:  49 y.o. SWF G0 for annual exam with complaint of intermittent joint discomfort, mild rash at hairline from hair conditioner.    Irregular cycles for the past year every 1-3 months/BTL/not  sexually active HSV-1  Plan: SBE's, annual screening mammogram instructed to schedule overdue. Acyclovir 200 mg 5 times daily as needed prescription, proper use given and reviewed. Reviewed importance of regular exercise, calcium rich diet, vitamin D 1000 daily encouraged. Instructed to call if continued problems with joint discomfort. Valisone prescription given for mild rash behind ears from hair conditioner, has had for years has had evaluated from dermatologist in the past. CBC, glucose, lipid panel, UA, Pap with HR HPV typing, new screening guidelines reviewed.       Harrington ChallengerYOUNG,Pete Merten J [facility]WHNP, 3:25 PM 06/18/2014

## 2014-06-19 ENCOUNTER — Other Ambulatory Visit: Payer: Self-pay

## 2014-06-19 ENCOUNTER — Other Ambulatory Visit: Payer: 59

## 2014-06-19 DIAGNOSIS — Z1231 Encounter for screening mammogram for malignant neoplasm of breast: Secondary | ICD-10-CM

## 2014-06-19 LAB — CBC WITH DIFFERENTIAL/PLATELET
Basophils Absolute: 0.1 K/uL (ref 0.0–0.1)
Basophils Relative: 1 % (ref 0–1)
Eosinophils Absolute: 0.1 K/uL (ref 0.0–0.7)
Eosinophils Relative: 2 % (ref 0–5)
HCT: 37.6 % (ref 36.0–46.0)
Hemoglobin: 12.5 g/dL (ref 12.0–15.0)
Lymphocytes Relative: 20 % (ref 12–46)
Lymphs Abs: 1.2 K/uL (ref 0.7–4.0)
MCH: 31.5 pg (ref 26.0–34.0)
MCHC: 33.2 g/dL (ref 30.0–36.0)
MCV: 94.7 fL (ref 78.0–100.0)
MPV: 11.5 fL (ref 8.6–12.4)
Monocytes Absolute: 0.5 K/uL (ref 0.1–1.0)
Monocytes Relative: 9 % (ref 3–12)
Neutro Abs: 4.1 K/uL (ref 1.7–7.7)
Neutrophils Relative %: 68 % (ref 43–77)
Platelets: 202 K/uL (ref 150–400)
RBC: 3.97 MIL/uL (ref 3.87–5.11)
RDW: 13.4 % (ref 11.5–15.5)
WBC: 6 K/uL (ref 4.0–10.5)

## 2014-06-19 LAB — LIPID PANEL
Cholesterol: 146 mg/dL (ref 0–200)
HDL: 47 mg/dL
LDL Cholesterol: 87 mg/dL (ref 0–99)
Total CHOL/HDL Ratio: 3.1 ratio
Triglycerides: 61 mg/dL
VLDL: 12 mg/dL (ref 0–40)

## 2014-06-19 LAB — URINALYSIS W MICROSCOPIC + REFLEX CULTURE
BACTERIA UA: NONE SEEN
BILIRUBIN URINE: NEGATIVE
Casts: NONE SEEN
Crystals: NONE SEEN
Glucose, UA: NEGATIVE mg/dL
Ketones, ur: NEGATIVE mg/dL
NITRITE: NEGATIVE
PROTEIN: NEGATIVE mg/dL
SQUAMOUS EPITHELIAL / LPF: NONE SEEN
Specific Gravity, Urine: 1.008 (ref 1.005–1.030)
UROBILINOGEN UA: 0.2 mg/dL (ref 0.0–1.0)
pH: 5.5 (ref 5.0–8.0)

## 2014-06-19 LAB — GLUCOSE, RANDOM: Glucose, Bld: 78 mg/dL (ref 70–99)

## 2014-06-20 ENCOUNTER — Other Ambulatory Visit: Payer: Self-pay | Admitting: Women's Health

## 2014-06-20 ENCOUNTER — Telehealth: Payer: Self-pay | Admitting: *Deleted

## 2014-06-20 LAB — CYTOLOGY - PAP

## 2014-06-20 LAB — URINE CULTURE
COLONY COUNT: NO GROWTH
ORGANISM ID, BACTERIA: NO GROWTH

## 2014-06-20 MED ORDER — HYDROCORTISONE VALERATE 0.2 % EX OINT: 1.0000 "application " | TOPICAL_OINTMENT | Freq: Two times a day (BID) | CUTANEOUS | Status: DC

## 2014-06-20 NOTE — Telephone Encounter (Signed)
Please call and let her know we called in Westcort cream that may be less expensive . rx escribed

## 2014-06-20 NOTE — Telephone Encounter (Signed)
Pt informed

## 2014-06-20 NOTE — Telephone Encounter (Signed)
Pt received Rx for Valisone 0.05 % cream, medication is $65.00 unable pay for this. Pt asked for alternate medication. Please advise

## 2014-06-24 ENCOUNTER — Ambulatory Visit
Admission: RE | Admit: 2014-06-24 | Discharge: 2014-06-24 | Disposition: A | Discharge status: Home / Self Care | Payer: 59 | Source: Ambulatory Visit

## 2014-06-24 DIAGNOSIS — Z1231 Encounter for screening mammogram for malignant neoplasm of breast: Secondary | ICD-10-CM

## 2015-02-11 ENCOUNTER — Other Ambulatory Visit: Payer: Self-pay | Admitting: Women's Health

## 2015-06-19 ENCOUNTER — Encounter: Payer: Self-pay | Admitting: Women's Health

## 2015-06-19 ENCOUNTER — Ambulatory Visit (INDEPENDENT_AMBULATORY_CARE_PROVIDER_SITE_OTHER): Payer: BLUE CROSS/BLUE SHIELD | Admitting: Women's Health

## 2015-06-19 VITALS — BP 122/79 | Ht 71.0 in | Wt 158.8 lb

## 2015-06-19 DIAGNOSIS — Z01419 Encounter for gynecological examination (general) (routine) without abnormal findings: Secondary | ICD-10-CM | POA: Diagnosis not present

## 2015-06-19 DIAGNOSIS — A609 Anogenital herpesviral infection, unspecified: Secondary | ICD-10-CM

## 2015-06-19 DIAGNOSIS — Z9851 Tubal ligation status: Secondary | ICD-10-CM | POA: Diagnosis not present

## 2015-06-19 DIAGNOSIS — N390 Urinary tract infection, site not specified: Secondary | ICD-10-CM

## 2015-06-19 DIAGNOSIS — Z13220 Encounter for screening for lipoid disorders: Secondary | ICD-10-CM | POA: Diagnosis not present

## 2015-06-19 DIAGNOSIS — Z833 Family history of diabetes mellitus: Secondary | ICD-10-CM | POA: Diagnosis not present

## 2015-06-19 LAB — CBC WITH DIFFERENTIAL/PLATELET
BASOS ABS: 0 {cells}/uL (ref 0–200)
Basophils Relative: 0 %
EOS ABS: 140 {cells}/uL (ref 15–500)
Eosinophils Relative: 2 %
HEMATOCRIT: 37.1 % (ref 35.0–45.0)
HEMOGLOBIN: 12.6 g/dL (ref 11.7–15.5)
LYMPHS ABS: 1050 {cells}/uL (ref 850–3900)
LYMPHS PCT: 15 %
MCH: 31.5 pg (ref 27.0–33.0)
MCHC: 34 g/dL (ref 32.0–36.0)
MCV: 92.8 fL (ref 80.0–100.0)
MONO ABS: 490 {cells}/uL (ref 200–950)
MPV: 11 fL (ref 7.5–12.5)
Monocytes Relative: 7 %
NEUTROS PCT: 76 %
Neutro Abs: 5320 cells/uL (ref 1500–7800)
Platelets: 218 10*3/uL (ref 140–400)
RBC: 4 MIL/uL (ref 3.80–5.10)
RDW: 13.9 % (ref 11.0–15.0)
WBC: 7 10*3/uL (ref 3.8–10.8)

## 2015-06-19 LAB — HEMOGLOBIN A1C
Hgb A1c MFr Bld: 5.5 % (ref <5.7)
Mean Plasma Glucose: 111 mg/dL

## 2015-06-19 MED ORDER — ACYCLOVIR 200 MG PO CAPS: 200.0000 mg | ORAL_CAPSULE | ORAL | Status: DC

## 2015-06-19 MED ORDER — NITROFURANTOIN MACROCRYSTAL 50 MG PO CAPS: ORAL_CAPSULE | ORAL | Status: DC

## 2015-06-19 NOTE — Progress Notes (Signed)
Diana Vazquez Sep 22, 1965 [id number]    History:    Presents for annual exam.  Cycles becoming a little less regular. Cycles had been regular every month, now coming between 3 weeks - 2 months, some months heavier than others. No bleeding between cycles. BTL. New partner, partner negative STD screen prior to relationship, denies need for STD screen. Normal Pap and mammogram history. HSV 1, rare outbreaks. Excellent lipid panel 2016.  Past medical history, past surgical history, family history and social history were all reviewed and documented in the EPIC chart. 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 Mother diabetes on insulin.  ROS:  A ROS was performed and pertinent positives and negatives are included.  Exam:  Filed Vitals:   06/19/15 0834  BP: 122/79    General appearance:  Normal Thyroid:  Symmetrical, normal in size, without palpable masses or nodularity. Respiratory  Auscultation:  Clear without wheezing or rhonchi Cardiovascular  Auscultation:  Regular rate, without rubs, murmurs or gallops  Edema/varicosities:  Not grossly evident Abdominal  Soft,nontender, without masses, guarding or rebound.  Liver/spleen:  No organomegaly noted  Hernia:  None appreciated  Skin  Inspection:  Grossly normal   Breasts: Examined lying and sitting.     Right: Without masses, retractions, discharge or axillary adenopathy.     Left: Without masses, retractions, discharge or axillary adenopathy. Gentitourinary   Inguinal/mons:  Normal without inguinal adenopathy  External genitalia:  Normal  BUS/Urethra/Skene's glands:  Normal  Vagina:  Normal  Cervix:  Normal  Uterus:   normal in size, shape and contour.  Midline and mobile  Adnexa/parametria:     Rt: Without masses or tenderness.   Lt: Without masses or tenderness.  Anus and perineum: Normal  Digital rectal exam: Normal sphincter tone without palpated masses or tenderness  Assessment/Plan:  50 y.o. S WF G0 for annual exam with  complaint of occasional vaginal irritation.  Perimenopausal cycles every 3 weeks to 2 months/BTL HSV-1 rare outbreaks  Plan: Zovirax 200 mg 5 times daily when necessary with outbreaks. SBE's, annual 3-D mammogram encouraged, history of dense breasts. Exercise, calcium rich diet, vitamin D 1000 daily encouraged. CBC, hemoglobin A1c. Reviewed wet prep negative, call or return if continued problems.  Pap normal negative HR HPV 2016, new screening guidelines reviewed.    Harrington ChallengerYOUNG,NANCY J [facility]NP, 9:18 AM 06/19/2015

## 2015-06-19 NOTE — Patient Instructions (Signed)
Health Maintenance, Female Adopting a healthy lifestyle and getting preventive care can go a long way to promote health and wellness. Talk with your health care provider about what schedule of regular examinations is right for you. This is a good chance for you to check in with your provider about disease prevention and staying healthy. In between checkups, there are plenty of things you can do on your own. Experts have done a lot of research about which lifestyle changes and preventive measures are most likely to keep you healthy. Ask your health care provider for more information. WEIGHT AND DIET  Eat a healthy diet  Be sure to include plenty of vegetables, fruits, low-fat dairy products, and lean protein.  Do not eat a lot of foods high in solid fats, added sugars, or salt.  Get regular exercise. This is one of the most important things you can do for your health.  Most adults should exercise for at least 150 minutes each week. The exercise should increase your heart rate and make you sweat (moderate-intensity exercise).  Most adults should also do strengthening exercises at least twice a week. This is in addition to the moderate-intensity exercise.  Maintain a healthy weight  Body mass index (BMI) is a measurement that can be used to identify possible weight problems. It estimates body fat based on height and weight. Your health care provider can help determine your BMI and help you achieve or maintain a healthy weight.  For females 20 years of age and older:   A BMI below 18.5 is considered underweight.  A BMI of 18.5 to 24.9 is normal.  A BMI of 25 to 29.9 is considered overweight.  A BMI of 30 and above is considered obese.  Watch levels of cholesterol and blood lipids  You should start having your blood tested for lipids and cholesterol at 48 years of age, then have this test every 5 years.  You may need to have your cholesterol levels checked more often if:  Your lipid  or cholesterol levels are high.  You are older than 48 years of age.  You are at high risk for heart disease.  CANCER SCREENING   Lung Cancer  Lung cancer screening is recommended for adults 55-80 years old who are at high risk for lung cancer because of a history of smoking.  A yearly low-dose CT scan of the lungs is recommended for people who:  Currently smoke.  Have quit within the past 15 years.  Have at least a 30-pack-year history of smoking. A pack year is smoking an average of one pack of cigarettes a day for 1 year.  Yearly screening should continue until it has been 15 years since you quit.  Yearly screening should stop if you develop a health problem that would prevent you from having lung cancer treatment.  Breast Cancer  Practice breast self-awareness. This means understanding how your breasts normally appear and feel.  It also means doing regular breast self-exams. Let your health care provider know about any changes, no matter how small.  If you are in your 20s or 30s, you should have a clinical breast exam (CBE) by a health care provider every 1-3 years as part of a regular health exam.  If you are 40 or older, have a CBE every year. Also consider having a breast X-ray (mammogram) every year.  If you have a family history of breast cancer, talk to your health care provider about genetic screening.  If you   are at high risk for breast cancer, talk to your health care provider about having an MRI and a mammogram every year.  Breast cancer gene (BRCA) assessment is recommended for women who have family members with BRCA-related cancers. BRCA-related cancers include:  Breast.  Ovarian.  Tubal.  Peritoneal cancers.  Results of the assessment will determine the need for genetic counseling and BRCA1 and BRCA2 testing. Cervical Cancer Your health care provider may recommend that you be screened regularly for cancer of the pelvic organs (ovaries, uterus, and  vagina). This screening involves a pelvic examination, including checking for microscopic changes to the surface of your cervix (Pap test). You may be encouraged to have this screening done every 3 years, beginning at age 21.  For women ages 30-65, health care providers may recommend pelvic exams and Pap testing every 3 years, or they may recommend the Pap and pelvic exam, combined with testing for human papilloma virus (HPV), every 5 years. Some types of HPV increase your risk of cervical cancer. Testing for HPV may also be done on women of any age with unclear Pap test results.  Other health care providers may not recommend any screening for nonpregnant women who are considered low risk for pelvic cancer and who do not have symptoms. Ask your health care provider if a screening pelvic exam is right for you.  If you have had past treatment for cervical cancer or a condition that could lead to cancer, you need Pap tests and screening for cancer for at least 20 years after your treatment. If Pap tests have been discontinued, your risk factors (such as having a new sexual partner) need to be reassessed to determine if screening should resume. Some women have medical problems that increase the chance of getting cervical cancer. In these cases, your health care provider may recommend more frequent screening and Pap tests. Colorectal Cancer  This type of cancer can be detected and often prevented.  Routine colorectal cancer screening usually begins at 48 years of age and continues through 48 years of age.  Your health care provider may recommend screening at an earlier age if you have risk factors for colon cancer.  Your health care provider may also recommend using home test kits to check for hidden blood in the stool.  A small camera at the end of a tube can be used to examine your colon directly (sigmoidoscopy or colonoscopy). This is done to check for the earliest forms of colorectal  cancer.  Routine screening usually begins at age 50.  Direct examination of the colon should be repeated every 5-10 years through 48 years of age. However, you may need to be screened more often if early forms of precancerous polyps or small growths are found. Skin Cancer  Check your skin from head to toe regularly.  Tell your health care provider about any new moles or changes in moles, especially if there is a change in a mole's shape or color.  Also tell your health care provider if you have a mole that is larger than the size of a pencil eraser.  Always use sunscreen. Apply sunscreen liberally and repeatedly throughout the day.  Protect yourself by wearing long sleeves, pants, a wide-brimmed hat, and sunglasses whenever you are outside. HEART DISEASE, DIABETES, AND HIGH BLOOD PRESSURE   High blood pressure causes heart disease and increases the risk of stroke. High blood pressure is more likely to develop in:  People who have blood pressure in the high end   of the normal range (130-139/85-89 mm Hg).  People who are overweight or obese.  People who are African American.  If you are 38-23 years of age, have your blood pressure checked every 3-5 years. If you are 61 years of age or older, have your blood pressure checked every year. You should have your blood pressure measured twice--once when you are at a hospital or clinic, and once when you are not at a hospital or clinic. Record the average of the two measurements. To check your blood pressure when you are not at a hospital or clinic, you can use:  An automated blood pressure machine at a pharmacy.  A home blood pressure monitor.  If you are between 45 years and 39 years old, ask your health care provider if you should take aspirin to prevent strokes.  Have regular diabetes screenings. This involves taking a blood sample to check your fasting blood sugar level.  If you are at a normal weight and have a low risk for diabetes,  have this test once every three years after 48 years of age.  If you are overweight and have a high risk for diabetes, consider being tested at a younger age or more often. PREVENTING INFECTION  Hepatitis B  If you have a higher risk for hepatitis B, you should be screened for this virus. You are considered at high risk for hepatitis B if:  You were born in a country where hepatitis B is common. Ask your health care provider which countries are considered high risk.  Your parents were born in a high-risk country, and you have not been immunized against hepatitis B (hepatitis B vaccine).  You have HIV or AIDS.  You use needles to inject street drugs.  You live with someone who has hepatitis B.  You have had sex with someone who has hepatitis B.  You get hemodialysis treatment.  You take certain medicines for conditions, including cancer, organ transplantation, and autoimmune conditions. Hepatitis C  Blood testing is recommended for:  Everyone born from [birth year] through 1963.  Anyone with known risk factors for hepatitis C. Sexually transmitted infections (STIs)  You should be screened for sexually transmitted infections (STIs) including gonorrhea and chlamydia if:  You are sexually active and are younger than 48 years of age.  You are older than 48 years of age and your health care provider tells you that you are at risk for this type of infection.  Your sexual activity has changed since you were last screened and you are at an increased risk for chlamydia or gonorrhea. Ask your health care provider if you are at risk.  If you do not have HIV, but are at risk, it may be recommended that you take a prescription medicine daily to prevent HIV infection. This is called pre-exposure prophylaxis (PrEP). You are considered at risk if:  You are sexually active and do not regularly use condoms or know the HIV status of your partner(s).  You take drugs by injection.  You are sexually  active with a partner who has HIV. Talk with your health care provider about whether you are at high risk of being infected with HIV. If you choose to begin PrEP, you should first be tested for HIV. You should then be tested every 3 months for as long as you are taking PrEP.  PREGNANCY   If you are premenopausal and you may become pregnant, ask your health care provider about preconception counseling.  If you may  become pregnant, take 400 to 800 micrograms (mcg) of folic acid every day.  If you want to prevent pregnancy, talk to your health care provider about birth control (contraception). OSTEOPOROSIS AND MENOPAUSE   Osteoporosis is a disease in which the bones lose minerals and strength with aging. This can result in serious bone fractures. Your risk for osteoporosis can be identified using a bone density scan.  If you are 61 years of age or older, or if you are at risk for osteoporosis and fractures, ask your health care provider if you should be screened.  Ask your health care provider whether you should take a calcium or vitamin D supplement to lower your risk for osteoporosis.  Menopause may have certain physical symptoms and risks.  Hormone replacement therapy may reduce some of these symptoms and risks. Talk to your health care provider about whether hormone replacement therapy is right for you.  HOME CARE INSTRUCTIONS   Schedule regular health, dental, and eye exams.  Stay current with your immunizations.   Do not use any tobacco products including cigarettes, chewing tobacco, or electronic cigarettes.  If you are pregnant, do not drink alcohol.  If you are breastfeeding, limit how much and how often you drink alcohol.  Limit alcohol intake to no more than 1 drink per day for nonpregnant women. One drink equals 12 ounces of beer, 5 ounces of wine, or 1 ounces of hard liquor.  Do not use street drugs.  Do not share needles.  Ask your health care provider for help if  you need support or information about quitting drugs.  Tell your health care provider if you often feel depressed.  Tell your health care provider if you have ever been abused or do not feel safe at home.   This information is not intended to replace advice given to you by your health care provider. Make sure you discuss any questions you have with your health care provider.   Document Released: 08/30/2010 Document Revised: 03/07/2014 Document Reviewed: 01/16/2013 Elsevier Interactive Patient Education [employer].

## 2015-07-07 ENCOUNTER — Telehealth: Payer: Self-pay | Admitting: *Deleted

## 2015-07-07 MED ORDER — VALACYCLOVIR HCL 500 MG PO TABS
500.0000 mg | ORAL_TABLET | Freq: Every day | ORAL | Status: DC
Start: 2015-07-07 — End: 2016-06-29

## 2015-07-07 NOTE — Telephone Encounter (Signed)
Pt informed with the below note, Rx sent. 

## 2015-07-07 NOTE — Telephone Encounter (Signed)
Options would be Valtrex 500 mg daily to suppress outbreaks. It has also been shown to decrease the risk of transmission but is not 100% effective. There can be transmission the partners despite being on the medication. Patient interested in starting this then Valtrex 500 mg daily #30 refill 1 year

## 2015-07-07 NOTE — Telephone Encounter (Signed)
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 Pt had outbreak this past weekend normally she have symptoms when outbreak is going to occur, this did not happen this weekend. Pt asked what can to do avoid spreading HSV to her boyfriend if no symptoms? Please advise

## 2015-11-06 ENCOUNTER — Other Ambulatory Visit: Payer: Self-pay | Admitting: Women's Health

## 2015-11-06 DIAGNOSIS — N946 Dysmenorrhea, unspecified: Secondary | ICD-10-CM

## 2015-11-06 NOTE — Telephone Encounter (Signed)
Last filled in Dec. 2016

## 2016-01-08 ENCOUNTER — Telehealth: Payer: Self-pay

## 2016-01-08 DIAGNOSIS — A609 Anogenital herpesviral infection, unspecified: Secondary | ICD-10-CM

## 2016-01-08 NOTE — Telephone Encounter (Signed)
Patient called to ask if you could prescribed a 90 day supply of her Acyclovir instead of 30 day as it is less expensive for her. When I went to do that I saw the directions were 1 every 4 hour while awake. I called her back to clarify how she is taking. She said she is taking them only with cold sore out breaks and takes 5 daily when she does.  She asked about going on suppressant therapy as she is getting cold sore outbreak twice a month now and hard to catch it with the Acyclovir. Quanitity needs to somewhat equal directions for insurance co to approve.

## 2016-01-10 NOTE — Telephone Encounter (Signed)
Ok acyclovir 200mg  daily #90 with 4 refills, have her take 1 extra if she feels like an outbreak is occurring.  Tell her she should be able to get 90 of the 200mg  [facility]

## 2016-01-11 MED ORDER — ACYCLOVIR 200 MG PO CAPS: ORAL_CAPSULE | ORAL | 2 refills | Status: DC

## 2016-01-11 NOTE — Telephone Encounter (Deleted)
System shut down in middle of typing this note.

## 2016-01-11 NOTE — Telephone Encounter (Signed)
Left message to call.

## 2016-01-12 NOTE — Telephone Encounter (Signed)
Patient informed. Rx sent 

## 2016-02-17 ENCOUNTER — Encounter: Payer: Self-pay | Admitting: *Deleted

## 2016-02-17 ENCOUNTER — Telehealth: Payer: Self-pay | Admitting: *Deleted

## 2016-02-17 NOTE — Telephone Encounter (Signed)
Pt informed, letter will be mailed.

## 2016-02-17 NOTE — Telephone Encounter (Signed)
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 She doesn't have a PCP. Harriett Sineancy if you approve this letter let me know what you want it say and I will type a letter in patient chart and mail to her to pick up. Please advise

## 2016-02-17 NOTE — Telephone Encounter (Signed)
Thank you, okay to write a letter requesting lumbar support chair to help reduce/eliminate low back pain.

## 2016-06-23 ENCOUNTER — Encounter: Payer: BLUE CROSS/BLUE SHIELD | Admitting: Women's Health

## 2016-06-29 ENCOUNTER — Encounter: Payer: Self-pay | Admitting: Women's Health

## 2016-06-29 ENCOUNTER — Ambulatory Visit (INDEPENDENT_AMBULATORY_CARE_PROVIDER_SITE_OTHER): Payer: BLUE CROSS/BLUE SHIELD | Admitting: Women's Health

## 2016-06-29 ENCOUNTER — Other Ambulatory Visit: Payer: Self-pay | Admitting: Women's Health

## 2016-06-29 VITALS — BP 122/78 | Ht 71.0 in | Wt 162.0 lb

## 2016-06-29 DIAGNOSIS — Z79890 Hormone replacement therapy (postmenopausal): Secondary | ICD-10-CM | POA: Diagnosis not present

## 2016-06-29 DIAGNOSIS — B009 Herpesviral infection, unspecified: Secondary | ICD-10-CM

## 2016-06-29 DIAGNOSIS — Z13220 Encounter for screening for lipoid disorders: Secondary | ICD-10-CM

## 2016-06-29 DIAGNOSIS — Z01419 Encounter for gynecological examination (general) (routine) without abnormal findings: Secondary | ICD-10-CM | POA: Diagnosis not present

## 2016-06-29 DIAGNOSIS — N946 Dysmenorrhea, unspecified: Secondary | ICD-10-CM

## 2016-06-29 MED ORDER — ACYCLOVIR 200 MG PO CAPS: ORAL_CAPSULE | ORAL | 2 refills | Status: DC

## 2016-06-29 MED ORDER — PROGESTERONE MICRONIZED 200 MG PO CAPS: 200.0000 mg | ORAL_CAPSULE | Freq: Every day | ORAL | 4 refills | Status: DC

## 2016-06-29 MED ORDER — ESTRADIOL 0.5 MG PO TABS: 0.5000 mg | ORAL_TABLET | Freq: Every day | ORAL | 4 refills | Status: DC

## 2016-06-29 NOTE — Progress Notes (Signed)
Diana Vazquez 1965/09/20 [id number]    History:    Presents for annual exam.  Regular monthly cycles until approximately 1 year ago and has been amenorrheic. Over the past year increased hot flushes and difficulty with sleep. Numerous hot flushes throughout the night and increased appetite since cycles have stopped.Marland Kitchen History of a BTL. Same partner. Normal Pap and mammogram history. Overdue for mammogram. HSV 1 rare outbreaks.   Past medical history, past surgical history, family history and social history were all reviewed and documented in the EPIC chart. Works one hour from home. Mother type 2 diabetes slim.  ROS:  A ROS was performed and pertinent positives and negatives are included.  Exam:  Vitals:   06/29/16 1003  BP: 122/78  Weight: 162 lb (73.5 kg)  Height:  (1.803 m)   Body mass index is 22.59 kg/m.   General appearance:  Normal Thyroid:  Symmetrical, normal in size, without palpable masses or nodularity. Respiratory  Auscultation:  Clear without wheezing or rhonchi Cardiovascular  Auscultation:  Regular rate, without rubs, murmurs or gallops  Edema/varicosities:  Not grossly evident Abdominal  Soft,nontender, without masses, guarding or rebound.  Liver/spleen:  No organomegaly noted  Hernia:  None appreciated  Skin  Inspection:  Grossly normal   Breasts: Examined lying and sitting.     Right: Without masses, retractions, discharge or axillary adenopathy.     Left: Without masses, retractions, discharge or axillary adenopathy. Gentitourinary   Inguinal/mons:  Normal without inguinal adenopathy  External genitalia:  Normal  BUS/Urethra/Skene's glands:  Normal  Vagina:  Normal  Cervix:  Normal  Uterus:  normal in size, shape and contour.  Midline and mobile  Adnexa/parametria:     Rt: Without masses or tenderness.   Lt: Without masses or tenderness.  Anus and perineum: Normal  Digital rectal exam: Normal sphincter tone without palpated masses or  tenderness  Assessment/Plan:  51 y.o. S W[place] G0 for annual exam.     Postmenopausal almost 1 year with numerous hot flushes/poor sleep BTL HSV-1  Plan: Menopause reviewed, options reviewed, would like to try HRT. Reviewed risks of blood clots, strokes and breast cancer. Estradiol 0.5 mg daily, Prometrium 200 mg at bedtime day 1 through 12 of each month. Instructed to call if no relief of hot flushes. Reviewed may get a small withdrawal bleed after Prometrium each month. SBE's, reviewed importance of annual screen, 3-D tomography reviewed and encouraged history of dense breast overdue instructed to schedule. Encouraged regular exercise, calcium rich diet, vitamin D 2000 daily encouraged. Acyclovir 200 mg 5 times daily as needed. Prescription, proper use given and reviewed. Will return to office for fasting labs of CBC, CMP, lipid panel. Pap normal with negative HR HPV 2016, new screening guidelines reviewed.    Harrington [address], 1:07 PM 06/29/2016

## 2016-06-29 NOTE — Patient Instructions (Signed)
Health Maintenance for Postmenopausal Women Menopause is a normal process in which your reproductive ability comes to an end. This process happens gradually over a span of months to years, usually between the ages of 33 and 38. Menopause is complete when you have missed 12 consecutive menstrual periods. It is important to talk with your health care provider about some of the most common conditions that affect postmenopausal women, such as heart disease, cancer, and bone loss (osteoporosis). Adopting a healthy lifestyle and getting preventive care can help to promote your health and wellness. Those actions can also lower your chances of developing some of these common conditions. What should I know about menopause? During menopause, you may experience a number of symptoms, such as:  Moderate-to-severe hot flashes.  Night sweats.  Decrease in sex drive.  Mood swings.  Headaches.  Tiredness.  Irritability.  Memory problems.  Insomnia. Choosing to treat or not to treat menopausal changes is an individual decision that you make with your health care provider. What should I know about hormone replacement therapy and supplements? Hormone therapy products are effective for treating symptoms that are associated with menopause, such as hot flashes and night sweats. Hormone replacement carries certain risks, especially as you become older. If you are thinking about using estrogen or estrogen with progestin treatments, discuss the benefits and risks with your health care provider. What should I know about heart disease and stroke? Heart disease, heart attack, and stroke become more likely as you age. This may be due, in part, to the hormonal changes that your body experiences during menopause. These can affect how your body processes dietary fats, triglycerides, and cholesterol. Heart attack and stroke are both medical emergencies. There are many things that you can do to help prevent heart disease  and stroke:  Have your blood pressure checked at least every 1-2 years. High blood pressure causes heart disease and increases the risk of stroke.  If you are 48-61 years old, ask your health care provider if you should take aspirin to prevent a heart attack or a stroke.  Do not use any tobacco products, including cigarettes, chewing tobacco, or electronic cigarettes. If you need help quitting, ask your health care provider.  It is important to eat a healthy diet and maintain a healthy weight.  Be sure to include plenty of vegetables, fruits, low-fat dairy products, and lean protein.  Avoid eating foods that are high in solid fats, added sugars, or salt (sodium).  Get regular exercise. This is one of the most important things that you can do for your health.  Try to exercise for at least 150 minutes each week. The type of exercise that you do should increase your heart rate and make you sweat. This is known as moderate-intensity exercise.  Try to do strengthening exercises at least twice each week. Do these in addition to the moderate-intensity exercise.  Know your numbers.Ask your health care provider to check your cholesterol and your blood glucose. Continue to have your blood tested as directed by your health care provider. What should I know about cancer screening? There are several types of cancer. Take the following steps to reduce your risk and to catch any cancer development as early as possible. Breast Cancer  Practice breast self-awareness.  This means understanding how your breasts normally appear and feel.  It also means doing regular breast self-exams. Let your health care provider know about any changes, no matter how small.  If you are 40 or older,  have a clinician do a breast exam (clinical breast exam or CBE) every year. Depending on your age, family history, and medical history, it may be recommended that you also have a yearly breast X-ray (mammogram).  If you  have a family history of breast cancer, talk with your health care provider about genetic screening.  If you are at high risk for breast cancer, talk with your health care provider about having an MRI and a mammogram every year.  Breast cancer (BRCA) gene test is recommended for women who have family members with BRCA-related cancers. Results of the assessment will determine the need for genetic counseling and BRCA1 and for BRCA2 testing. BRCA-related cancers include these types:  Breast. This occurs in males or females.  Ovarian.  Tubal. This may also be called fallopian tube cancer.  Cancer of the abdominal or pelvic lining (peritoneal cancer).  Prostate.  Pancreatic. Cervical, Uterine, and Ovarian Cancer  Your health care provider may recommend that you be screened regularly for cancer of the pelvic organs. These include your ovaries, uterus, and vagina. This screening involves a pelvic exam, which includes checking for microscopic changes to the surface of your cervix (Pap test).  For women ages 21-65, health care providers may recommend a pelvic exam and a Pap test every three years. For women ages 23-65, they may recommend the Pap test and pelvic exam, combined with testing for human papilloma virus (HPV), every five years. Some types of HPV increase your risk of cervical cancer. Testing for HPV may also be done on women of any age who have unclear Pap test results.  Other health care providers may not recommend any screening for nonpregnant women who are considered low risk for pelvic cancer and have no symptoms. Ask your health care provider if a screening pelvic exam is right for you.  If you have had past treatment for cervical cancer or a condition that could lead to cancer, you need Pap tests and screening for cancer for at least 20 years after your treatment. If Pap tests have been discontinued for you, your risk factors (such as having a new sexual partner) need to be reassessed  to determine if you should start having screenings again. Some women have medical problems that increase the chance of getting cervical cancer. In these cases, your health care provider may recommend that you have screening and Pap tests more often.  If you have a family history of uterine cancer or ovarian cancer, talk with your health care provider about genetic screening.  If you have vaginal bleeding after reaching menopause, tell your health care provider.  There are currently no reliable tests available to screen for ovarian cancer. Lung Cancer  Lung cancer screening is recommended for adults 99-83 years old who are at high risk for lung cancer because of a history of smoking. A yearly low-dose CT scan of the lungs is recommended if you:  Currently smoke.  Have a history of at least 30 pack-years of smoking and you currently smoke or have quit within the past 15 years. A pack-year is smoking an average of one pack of cigarettes per day for one year. Yearly screening should:  Continue until it has been 15 years since you quit.  Stop if you develop a health problem that would prevent you from having lung cancer treatment. Colorectal Cancer  This type of cancer can be detected and can often be prevented.  Routine colorectal cancer screening usually begins at age 72 and continues  through age 75.  If you have risk factors for colon cancer, your health care provider may recommend that you be screened at an earlier age.  If you have a family history of colorectal cancer, talk with your health care provider about genetic screening.  Your health care provider may also recommend using home test kits to check for hidden blood in your stool.  A small camera at the end of a tube can be used to examine your colon directly (sigmoidoscopy or colonoscopy). This is done to check for the earliest forms of colorectal cancer.  Direct examination of the colon should be repeated every 5-10 years until  age 75. However, if early forms of precancerous polyps or small growths are found or if you have a family history or genetic risk for colorectal cancer, you may need to be screened more often. Skin Cancer  Check your skin from head to toe regularly.  Monitor any moles. Be sure to tell your health care provider:  About any new moles or changes in moles, especially if there is a change in a mole's shape or color.  If you have a mole that is larger than the size of a pencil eraser.  If any of your family members has a history of skin cancer, especially at a Toniqua Melamed age, talk with your health care provider about genetic screening.  Always use sunscreen. Apply sunscreen liberally and repeatedly throughout the day.  Whenever you are outside, protect yourself by wearing long sleeves, pants, a wide-brimmed hat, and sunglasses. What should I know about osteoporosis? Osteoporosis is a condition in which bone destruction happens more quickly than new bone creation. After menopause, you may be at an increased risk for osteoporosis. To help prevent osteoporosis or the bone fractures that can happen because of osteoporosis, the following is recommended:  If you are 19-50 years old, get at least 1,000 mg of calcium and at least 600 mg of vitamin D per day.  If you are older than age 50 but younger than age 70, get at least 1,200 mg of calcium and at least 600 mg of vitamin D per day.  If you are older than age 70, get at least 1,200 mg of calcium and at least 800 mg of vitamin D per day. Smoking and excessive alcohol intake increase the risk of osteoporosis. Eat foods that are rich in calcium and vitamin D, and do weight-bearing exercises several times each week as directed by your health care provider. What should I know about how menopause affects my mental health? Depression may occur at any age, but it is more common as you become older. Common symptoms of depression include:  Low or sad  mood.  Changes in sleep patterns.  Changes in appetite or eating patterns.  Feeling an overall lack of motivation or enjoyment of activities that you previously enjoyed.  Frequent crying spells. Talk with your health care provider if you think that you are experiencing depression. What should I know about immunizations? It is important that you get and maintain your immunizations. These include:  Tetanus, diphtheria, and pertussis (Tdap) booster vaccine.  Influenza every year before the flu season begins.  Pneumonia vaccine.  Shingles vaccine. Your health care provider may also recommend other immunizations. This information is not intended to replace advice given to you by your health care provider. Make sure you discuss any questions you have with your health care provider. Document Released: 04/08/2005 Document Revised: 09/04/2015 Document Reviewed: 11/18/2014 Elsevier Interactive Patient   Education  2017 [employer] Carbohydrate Counting for Diabetes Mellitus, Adult Carbohydrate counting is a method for keeping track of how many carbohydrates you eat. Eating carbohydrates naturally increases the amount of sugar (glucose) in the blood. Counting how many carbohydrates you eat helps keep your blood glucose within normal limits, which helps you manage your diabetes (diabetes mellitus). It is important to know how many carbohydrates you can safely have in each meal. This is different for every person. A diet and nutrition specialist (registered dietitian) can help you make a meal plan and calculate how many carbohydrates you should have at each meal and snack. Carbohydrates are found in the following foods:  Grains, such as breads and cereals.  Dried beans and soy products.  Starchy vegetables, such as potatoes, peas, and corn.  Fruit and fruit juices.  Milk and yogurt.  Sweets and snack foods, such as cake, cookies, candy, chips, and soft drinks. How do I count  carbohydrates? There are two ways to count carbohydrates in food. You can use either of the methods or a combination of both. Reading "Nutrition Facts" on packaged food  The "Nutrition Facts" list is included on the labels of almost all packaged foods and beverages in the [place] It includes:  The serving size.  Information about nutrients in each serving, including the grams (g) of carbohydrate per serving. To use the "Nutrition Facts":  Decide how many servings you will have.  Multiply the number of servings by the number of carbohydrates per serving.  The resulting number is the total amount of carbohydrates that you will be having. Learning standard serving sizes of other foods  When you eat foods containing carbohydrates that are not packaged or do not include "Nutrition Facts" on the label, you need to measure the servings in order to count the amount of carbohydrates:  Measure the foods that you will eat with a food scale or measuring cup, if needed.  Decide how many standard-size servings you will eat.  Multiply the number of servings by 15. Most carbohydrate-rich foods have about 15 g of carbohydrates per serving.  For example, if you eat 8 oz (170 g) of strawberries, you will have eaten 2 servings and 30 g of carbohydrates (2 servings x 15 g = 30 g).  For foods that have more than one food mixed, such as soups and casseroles, you must count the carbohydrates in each food that is included. The following list contains standard serving sizes of common carbohydrate-rich foods. Each of these servings has about 15 g of carbohydrates:   hamburger bun or  English muffin.   oz (15 mL) syrup.   oz (14 g) jelly.  1 slice of bread.  1 six-inch tortilla.  3 oz (85 g) cooked rice or pasta.  4 oz (113 g) cooked dried beans.  4 oz (113 g) starchy vegetable, such as peas, corn, or potatoes.  4 oz (113 g) hot cereal.  4 oz (113 g) mashed potatoes or  of a large baked  potato.  4 oz (113 g) canned or frozen fruit.  4 oz (120 mL) fruit juice.  4-6 crackers.  6 chicken nuggets.  6 oz (170 g) unsweetened dry cereal.  6 oz (170 g) plain fat-free yogurt or yogurt sweetened with artificial sweeteners.  8 oz (240 mL) milk.  8 oz (170 g) fresh fruit or one small piece of fruit.  24 oz (680 g) popped popcorn. Example of carbohydrate counting Sample meal   3 oz (  85 g) chicken breast.  6 oz (170 g) brown rice.  4 oz (113 g) corn.  8 oz (240 mL) milk.  8 oz (170 g) strawberries with sugar-free whipped topping. Carbohydrate calculation  1. Identify the foods that contain carbohydrates:  Rice.  Corn.  Milk.  Strawberries. 2. Calculate how many servings you have of each food:  2 servings rice.  1 serving corn.  1 serving milk.  1 serving strawberries. 3. Multiply each number of servings by 15 g:  2 servings rice x 15 g = 30 g.  1 serving corn x 15 g = 15 g.  1 serving milk x 15 g = 15 g.  1 serving strawberries x 15 g = 15 g. 4. Add together all of the amounts to find the total grams of carbohydrates eaten:  30 g + 15 g + 15 g + 15 g = 75 g of carbohydrates total. This information is not intended to replace advice given to you by your health care provider. Make sure you discuss any questions you have with your health care provider. Document Released: 02/14/2005 Document Revised: 09/04/2015 Document Reviewed: 07/29/2015 Elsevier Interactive Patient Education  2017 [employer]  

## 2016-06-30 LAB — URINALYSIS W MICROSCOPIC + REFLEX CULTURE
BILIRUBIN URINE: NEGATIVE
Bacteria, UA: NONE SEEN [HPF]
CRYSTALS: NONE SEEN [HPF]
Casts: NONE SEEN [LPF]
GLUCOSE, UA: NEGATIVE
Hgb urine dipstick: NEGATIVE
KETONES UR: NEGATIVE
LEUKOCYTES UA: NEGATIVE
Nitrite: NEGATIVE
Protein, ur: NEGATIVE
RBC / HPF: NONE SEEN RBC/HPF (ref <=2)
SPECIFIC GRAVITY, URINE: 1.007 (ref 1.001–1.035)
Squamous Epithelial / LPF: NONE SEEN [HPF] (ref <=5)
WBC UA: NONE SEEN WBC/HPF (ref <=5)
Yeast: NONE SEEN [HPF]
pH: 5.5 (ref 5.0–8.0)

## 2016-07-06 ENCOUNTER — Other Ambulatory Visit: Payer: Self-pay | Admitting: Women's Health

## 2016-07-06 ENCOUNTER — Other Ambulatory Visit: Payer: BLUE CROSS/BLUE SHIELD

## 2016-07-06 DIAGNOSIS — Z1231 Encounter for screening mammogram for malignant neoplasm of breast: Secondary | ICD-10-CM

## 2016-07-06 DIAGNOSIS — Z13220 Encounter for screening for lipoid disorders: Secondary | ICD-10-CM

## 2016-07-06 DIAGNOSIS — Z01419 Encounter for gynecological examination (general) (routine) without abnormal findings: Secondary | ICD-10-CM

## 2016-07-06 LAB — CBC WITH DIFFERENTIAL/PLATELET
BASOS ABS: 69 {cells}/uL (ref 0–200)
Basophils Relative: 1 %
Eosinophils Absolute: 207 cells/uL (ref 15–500)
Eosinophils Relative: 3 %
HEMATOCRIT: 39.2 % (ref 35.0–45.0)
HEMOGLOBIN: 12.8 g/dL (ref 11.7–15.5)
LYMPHS ABS: 1518 {cells}/uL (ref 850–3900)
Lymphocytes Relative: 22 %
MCH: 31.1 pg (ref 27.0–33.0)
MCHC: 32.7 g/dL (ref 32.0–36.0)
MCV: 95.1 fL (ref 80.0–100.0)
MPV: 11.4 fL (ref 7.5–12.5)
Monocytes Absolute: 552 cells/uL (ref 200–950)
Monocytes Relative: 8 %
NEUTROS PCT: 66 %
Neutro Abs: 4554 cells/uL (ref 1500–7800)
Platelets: 229 10*3/uL (ref 140–400)
RBC: 4.12 MIL/uL (ref 3.80–5.10)
RDW: 13.2 % (ref 11.0–15.0)
WBC: 6.9 10*3/uL (ref 3.8–10.8)

## 2016-07-06 LAB — COMPREHENSIVE METABOLIC PANEL
ALBUMIN: 4.1 g/dL (ref 3.6–5.1)
ALT: 10 U/L (ref 6–29)
AST: 14 U/L (ref 10–35)
Alkaline Phosphatase: 68 U/L (ref 33–115)
BUN: 15 mg/dL (ref 7–25)
CALCIUM: 8.8 mg/dL (ref 8.6–10.2)
CHLORIDE: 101 mmol/L (ref 98–110)
CO2: 25 mmol/L (ref 20–31)
Creat: 0.91 mg/dL (ref 0.50–1.10)
Glucose, Bld: 81 mg/dL (ref 65–99)
Potassium: 4.1 mmol/L (ref 3.5–5.3)
SODIUM: 135 mmol/L (ref 135–146)
Total Bilirubin: 0.5 mg/dL (ref 0.2–1.2)
Total Protein: 7.2 g/dL (ref 6.1–8.1)

## 2016-07-06 LAB — LIPID PANEL
CHOL/HDL RATIO: 3 ratio (ref <5.0)
Cholesterol: 167 mg/dL (ref <200)
HDL: 56 mg/dL (ref >50)
LDL CALC: 99 mg/dL (ref <100)
TRIGLYCERIDES: 59 mg/dL (ref <150)
VLDL: 12 mg/dL (ref <30)

## 2016-07-06 MED ORDER — MEDROXYPROGESTERONE ACETATE 10 MG PO TABS: 10.0000 mg | ORAL_TABLET | Freq: Every day | ORAL | 4 refills | Status: DC

## 2016-07-13 ENCOUNTER — Encounter: Payer: Self-pay | Admitting: Gynecology

## 2016-07-27 ENCOUNTER — Ambulatory Visit
Admission: RE | Admit: 2016-07-27 | Discharge: 2016-07-27 | Disposition: A | Discharge status: Home / Self Care | Payer: 59 | Source: Ambulatory Visit | Attending: Women's Health | Admitting: Women's Health

## 2016-07-27 DIAGNOSIS — Z1231 Encounter for screening mammogram for malignant neoplasm of breast: Secondary | ICD-10-CM

## 2017-01-27 ENCOUNTER — Other Ambulatory Visit: Payer: Self-pay | Admitting: Women's Health

## 2017-01-27 DIAGNOSIS — Z79890 Hormone replacement therapy (postmenopausal): Secondary | ICD-10-CM

## 2017-03-06 ENCOUNTER — Other Ambulatory Visit: Payer: Self-pay | Admitting: Women's Health

## 2017-03-06 DIAGNOSIS — Z79890 Hormone replacement therapy (postmenopausal): Secondary | ICD-10-CM

## 2017-04-14 ENCOUNTER — Other Ambulatory Visit: Payer: Self-pay

## 2017-04-14 DIAGNOSIS — Z79890 Hormone replacement therapy (postmenopausal): Secondary | ICD-10-CM

## 2017-04-14 MED ORDER — ESTRADIOL 0.5 MG PO TABS: 0.5000 mg | ORAL_TABLET | Freq: Every day | ORAL | 0 refills | Status: DC

## 2017-04-14 MED ORDER — MEDROXYPROGESTERONE ACETATE 10 MG PO TABS: 10.0000 mg | ORAL_TABLET | Freq: Every day | ORAL | 0 refills | Status: DC

## 2017-05-23 ENCOUNTER — Telehealth: Payer: Self-pay | Admitting: *Deleted

## 2017-05-23 MED ORDER — MEDROXYPROGESTERONE ACETATE 10 MG PO TABS: ORAL_TABLET | ORAL | 0 refills | Status: DC

## 2017-05-23 NOTE — Telephone Encounter (Signed)
Pt called requesting 30 day supply Rx for provera 10 mg tablet, states mail order has mailed to her address and she is out of medication. Rx sent.

## 2017-06-16 ENCOUNTER — Other Ambulatory Visit: Payer: Self-pay | Admitting: Women's Health

## 2017-06-16 DIAGNOSIS — Z1231 Encounter for screening mammogram for malignant neoplasm of breast: Secondary | ICD-10-CM

## 2017-06-17 ENCOUNTER — Other Ambulatory Visit: Payer: Self-pay | Admitting: Women's Health

## 2017-06-17 DIAGNOSIS — Z79890 Hormone replacement therapy (postmenopausal): Secondary | ICD-10-CM

## 2017-06-19 NOTE — Telephone Encounter (Signed)
annual scheduled on 07/05/17

## 2017-07-05 ENCOUNTER — Encounter: Payer: Self-pay | Admitting: Women's Health

## 2017-07-05 ENCOUNTER — Ambulatory Visit (INDEPENDENT_AMBULATORY_CARE_PROVIDER_SITE_OTHER): Payer: 59 | Admitting: Women's Health

## 2017-07-05 VITALS — BP 110/78 | Ht 71.0 in | Wt 155.0 lb

## 2017-07-05 DIAGNOSIS — Z13220 Encounter for screening for lipoid disorders: Secondary | ICD-10-CM | POA: Diagnosis not present

## 2017-07-05 DIAGNOSIS — B009 Herpesviral infection, unspecified: Secondary | ICD-10-CM

## 2017-07-05 DIAGNOSIS — Z79890 Hormone replacement therapy (postmenopausal): Secondary | ICD-10-CM

## 2017-07-05 DIAGNOSIS — Z01419 Encounter for gynecological examination (general) (routine) without abnormal findings: Secondary | ICD-10-CM | POA: Diagnosis not present

## 2017-07-05 LAB — COMPREHENSIVE METABOLIC PANEL
AG RATIO: 1.3 (calc) (ref 1.0–2.5)
ALBUMIN MSPROF: 3.9 g/dL (ref 3.6–5.1)
ALT: 8 U/L (ref 6–29)
AST: 13 U/L (ref 10–35)
Alkaline phosphatase (APISO): 59 U/L (ref 33–115)
BILIRUBIN TOTAL: 0.6 mg/dL (ref 0.2–1.2)
BUN: 11 mg/dL (ref 7–25)
CALCIUM: 8.8 mg/dL (ref 8.6–10.2)
CO2: 28 mmol/L (ref 20–32)
Chloride: 104 mmol/L (ref 98–110)
Creat: 0.81 mg/dL (ref 0.50–1.10)
Globulin: 2.9 g/dL (calc) (ref 1.9–3.7)
Glucose, Bld: 85 mg/dL (ref 65–99)
POTASSIUM: 4 mmol/L (ref 3.5–5.3)
SODIUM: 139 mmol/L (ref 135–146)
TOTAL PROTEIN: 6.8 g/dL (ref 6.1–8.1)

## 2017-07-05 LAB — CBC WITH DIFFERENTIAL/PLATELET
Basophils Absolute: 46 cells/uL (ref 0–200)
Basophils Relative: 0.6 %
EOS ABS: 92 {cells}/uL (ref 15–500)
Eosinophils Relative: 1.2 %
HEMATOCRIT: 35.5 % (ref 35.0–45.0)
HEMOGLOBIN: 12.4 g/dL (ref 11.7–15.5)
LYMPHS ABS: 993 {cells}/uL (ref 850–3900)
MCH: 32.4 pg (ref 27.0–33.0)
MCHC: 34.9 g/dL (ref 32.0–36.0)
MCV: 92.7 fL (ref 80.0–100.0)
MONOS PCT: 7.9 %
MPV: 11.4 fL (ref 7.5–12.5)
NEUTROS ABS: 5960 {cells}/uL (ref 1500–7800)
Neutrophils Relative %: 77.4 %
Platelets: 207 10*3/uL (ref 140–400)
RBC: 3.83 10*6/uL (ref 3.80–5.10)
RDW: 11.8 % (ref 11.0–15.0)
Total Lymphocyte: 12.9 %
WBC: 7.7 10*3/uL (ref 3.8–10.8)
WBCMIX: 608 {cells}/uL (ref 200–950)

## 2017-07-05 LAB — LIPID PANEL
Cholesterol: 150 mg/dL (ref <200)
HDL: 47 mg/dL — ABNORMAL LOW (ref >50)
LDL CHOLESTEROL (CALC): 88 mg/dL
Non-HDL Cholesterol (Calc): 103 mg/dL (calc) (ref <130)
TRIGLYCERIDES: 60 mg/dL (ref <150)
Total CHOL/HDL Ratio: 3.2 (calc) (ref <5.0)

## 2017-07-05 MED ORDER — ACYCLOVIR 200 MG PO CAPS: ORAL_CAPSULE | ORAL | 2 refills | Status: DC

## 2017-07-05 MED ORDER — MEDROXYPROGESTERONE ACETATE 10 MG PO TABS: ORAL_TABLET | ORAL | 4 refills | Status: DC

## 2017-07-05 MED ORDER — ESTRADIOL 0.5 MG PO TABS: 0.5000 mg | ORAL_TABLET | Freq: Every day | ORAL | 4 refills | Status: DC

## 2017-07-05 NOTE — Progress Notes (Signed)
l °

## 2017-07-05 NOTE — Patient Instructions (Addendum)
[facility]  Colonoscopy  Dr Carlean Purl  [place] Maintenance, Female Adopting a healthy lifestyle and getting preventive care can go a long way to promote health and wellness. Talk with your health care provider about what schedule of regular examinations is right for you. This is a good chance for you to check in with your provider about disease prevention and staying healthy. In between checkups, there are plenty of things you can do on your own. Experts have done a lot of research about which lifestyle changes and preventive measures are most likely to keep you healthy. Ask your health care provider for more information. Weight and diet Eat a healthy diet  Be sure to include plenty of vegetables, fruits, low-fat dairy products, and lean protein.  Do not eat a lot of foods high in solid fats, added sugars, or salt.  Get regular exercise. This is one of the most important things you can do for your health. ? Most adults should exercise for at least 150 minutes each week. The exercise should increase your heart rate and make you sweat (moderate-intensity exercise). ? Most adults should also do strengthening exercises at least twice a week. This is in addition to the moderate-intensity exercise.  Maintain a healthy weight  Body mass index (BMI) is a measurement that can be used to identify possible weight problems. It estimates body fat based on height and weight. Your health care provider can help determine your BMI and help you achieve or maintain a healthy weight.  For females 11 years of age and older: ? A BMI below 18.5 is considered underweight. ? A BMI of 18.5 to 24.9 is normal. ? A BMI of 25 to 29.9 is considered overweight. ? A BMI of 30 and above is considered obese.  Watch levels of cholesterol and blood lipids  You should start having your blood tested for lipids and cholesterol at 50 years of age, then have this test every 5 years.  You may need to have your  cholesterol levels checked more often if: ? Your lipid or cholesterol levels are high. ? You are older than 50 years of age. ? You are at high risk for heart disease.  Cancer screening Lung Cancer  Lung cancer screening is recommended for adults 55-78 years old who are at high risk for lung cancer because of a history of smoking.  A yearly low-dose CT scan of the lungs is recommended for people who: ? Currently smoke. ? Have quit within the past 15 years. ? Have at least a 30-pack-year history of smoking. A pack year is smoking an average of one pack of cigarettes a day for 1 year.  Yearly screening should continue until it has been 15 years since you quit.  Yearly screening should stop if you develop a health problem that would prevent you from having lung cancer treatment.  Breast Cancer  Practice breast self-awareness. This means understanding how your breasts normally appear and feel.  It also means doing regular breast self-exams. Let your health care provider know about any changes, no matter how small.  If you are in your 20s or 30s, you should have a clinical breast exam (CBE) by a health care provider every 1-3 years as part of a regular health exam.  If you are 42 or older, have a CBE every year. Also consider having a breast X-ray (mammogram) every year.  If you have a family history of breast cancer, talk to your health care provider  about genetic screening.  If you are at high risk for breast cancer, talk to your health care provider about having an MRI and a mammogram every year.  Breast cancer gene (BRCA) assessment is recommended for women who have family members with BRCA-related cancers. BRCA-related cancers include: ? Breast. ? Ovarian. ? Tubal. ? Peritoneal cancers.  Results of the assessment will determine the need for genetic counseling and BRCA1 and BRCA2 testing.  Cervical Cancer Your health care provider may recommend that you be screened regularly  for cancer of the pelvic organs (ovaries, uterus, and vagina). This screening involves a pelvic examination, including checking for microscopic changes to the surface of your cervix (Pap test). You may be encouraged to have this screening done every 3 years, beginning at age 19.  For women ages 50-65, health care providers may recommend pelvic exams and Pap testing every 3 years, or they may recommend the Pap and pelvic exam, combined with testing for human papilloma virus (HPV), every 5 years. Some types of HPV increase your risk of cervical cancer. Testing for HPV may also be done on women of any age with unclear Pap test results.  Other health care providers may not recommend any screening for nonpregnant women who are considered low risk for pelvic cancer and who do not have symptoms. Ask your health care provider if a screening pelvic exam is right for you.  If you have had past treatment for cervical cancer or a condition that could lead to cancer, you need Pap tests and screening for cancer for at least 20 years after your treatment. If Pap tests have been discontinued, your risk factors (such as having a new sexual partner) need to be reassessed to determine if screening should resume. Some women have medical problems that increase the chance of getting cervical cancer. In these cases, your health care provider may recommend more frequent screening and Pap tests.  Colorectal Cancer  This type of cancer can be detected and often prevented.  Routine colorectal cancer screening usually begins at 50 years of age and continues through 50 years of age.  Your health care provider may recommend screening at an earlier age if you have risk factors for colon cancer.  Your health care provider may also recommend using home test kits to check for hidden blood in the stool.  A small camera at the end of a tube can be used to examine your colon directly (sigmoidoscopy or colonoscopy). This is done to  check for the earliest forms of colorectal cancer.  Routine screening usually begins at age 28.  Direct examination of the colon should be repeated every 5-10 years through 50 years of age. However, you may need to be screened more often if early forms of precancerous polyps or small growths are found.  Skin Cancer  Check your skin from head to toe regularly.  Tell your health care provider about any new moles or changes in moles, especially if there is a change in a mole's shape or color.  Also tell your health care provider if you have a mole that is larger than the size of a pencil eraser.  Always use sunscreen. Apply sunscreen liberally and repeatedly throughout the day.  Protect yourself by wearing long sleeves, pants, a wide-brimmed hat, and sunglasses whenever you are outside.  Heart disease, diabetes, and high blood pressure  High blood pressure causes heart disease and increases the risk of stroke. High blood pressure is more likely to develop in: ?  People who have blood pressure in the high end of the normal range (130-139/85-89 mm Hg). ? People who are overweight or obese. ? People who are African American.  If you are 18-39 years of age, have your blood pressure checked every 3-5 years. If you are 40 years of age or older, have your blood pressure checked every year. You should have your blood pressure measured twice-once when you are at a hospital or clinic, and once when you are not at a hospital or clinic. Record the average of the two measurements. To check your blood pressure when you are not at a hospital or clinic, you can use: ? An automated blood pressure machine at a pharmacy. ? A home blood pressure monitor.  If you are between 55 years and 79 years old, ask your health care provider if you should take aspirin to prevent strokes.  Have regular diabetes screenings. This involves taking a blood sample to check your fasting blood sugar level. ? If you are at a  normal weight and have a low risk for diabetes, have this test once every three years after 50 years of age. ? If you are overweight and have a high risk for diabetes, consider being tested at a younger age or more often. Preventing infection Hepatitis B  If you have a higher risk for hepatitis B, you should be screened for this virus. You are considered at high risk for hepatitis B if: ? You were born in a country where hepatitis B is common. Ask your health care provider which countries are considered high risk. ? Your parents were born in a high-risk country, and you have not been immunized against hepatitis B (hepatitis B vaccine). ? You have HIV or AIDS. ? You use needles to inject street drugs. ? You live with someone who has hepatitis B. ? You have had sex with someone who has hepatitis B. ? You get hemodialysis treatment. ? You take certain medicines for conditions, including cancer, organ transplantation, and autoimmune conditions.  Hepatitis C  Blood testing is recommended for: ? Everyone born from 1945 through 1965. ? Anyone with known risk factors for hepatitis C.  Sexually transmitted infections (STIs)  You should be screened for sexually transmitted infections (STIs) including gonorrhea and chlamydia if: ? You are sexually active and are younger than 50 years of age. ? You are older than 50 years of age and your health care provider tells you that you are at risk for this type of infection. ? Your sexual activity has changed since you were last screened and you are at an increased risk for chlamydia or gonorrhea. Ask your health care provider if you are at risk.  If you do not have HIV, but are at risk, it may be recommended that you take a prescription medicine daily to prevent HIV infection. This is called pre-exposure prophylaxis (PrEP). You are considered at risk if: ? You are sexually active and do not regularly use condoms or know the HIV status of your  partner(s). ? You take drugs by injection. ? You are sexually active with a partner who has HIV.  Talk with your health care provider about whether you are at high risk of being infected with HIV. If you choose to begin PrEP, you should first be tested for HIV. You should then be tested every 3 months for as long as you are taking PrEP. Pregnancy  If you are premenopausal and you may become pregnant, ask your health   care provider about preconception counseling.  If you may become pregnant, take 400 to 800 micrograms (mcg) of folic acid every day.  If you want to prevent pregnancy, talk to your health care provider about birth control (contraception). Osteoporosis and menopause  Osteoporosis is a disease in which the bones lose minerals and strength with aging. This can result in serious bone fractures. Your risk for osteoporosis can be identified using a bone density scan.  If you are 65 years of age or older, or if you are at risk for osteoporosis and fractures, ask your health care provider if you should be screened.  Ask your health care provider whether you should take a calcium or vitamin D supplement to lower your risk for osteoporosis.  Menopause may have certain physical symptoms and risks.  Hormone replacement therapy may reduce some of these symptoms and risks. Talk to your health care provider about whether hormone replacement therapy is right for you. Follow these instructions at home:  Schedule regular health, dental, and eye exams.  Stay current with your immunizations.  Do not use any tobacco products including cigarettes, chewing tobacco, or electronic cigarettes.  If you are pregnant, do not drink alcohol.  If you are breastfeeding, limit how much and how often you drink alcohol.  Limit alcohol intake to no more than 1 drink per day for nonpregnant women. One drink equals 12 ounces of beer, 5 ounces of wine, or 1 ounces of hard liquor.  Do not use street  drugs.  Do not share needles.  Ask your health care provider for help if you need support or information about quitting drugs.  Tell your health care provider if you often feel depressed.  Tell your health care provider if you have ever been abused or do not feel safe at home. This information is not intended to replace advice given to you by your health care provider. Make sure you discuss any questions you have with your health care provider. Document Released: 08/30/2010 Document Revised: 07/23/2015 Document Reviewed: 11/18/2014 Elsevier Interactive Patient Education  2018 [employer]  

## 2017-07-05 NOTE — Progress Notes (Signed)
Diana Vazquez 1966-01-01 [id number]    History:    Presents for annual exam.  Menopausal on HRT with no bleeding and good relief of menopausal symptoms.  Normal Pap and mammogram history.  HSV 1 rare outbreaks.  History of a BTL.  Not sexually active, denies need for STD screen.  Past medical history, past surgical history, family history and social history were all reviewed and documented in the EPIC chart.  Mther diabetes.  Desk job, exercising regularly.  ROS:  A ROS was performed and pertinent positives and negatives are included.  Exam:  Vitals:   07/05/17 0801  BP: 110/78  Weight: 155 lb (70.3 kg)  Height:  (1.803 m)   Body mass index is 21.62 kg/m.   General appearance:  Normal mild  scoliosis Thyroid:  Symmetrical, normal in size, without palpable masses or nodularity. Respiratory  Auscultation:  Clear without wheezing or rhonchi Cardiovascular  Auscultation:  Regular rate, without rubs, murmurs or gallops  Edema/varicosities:  Not grossly evident Abdominal  Soft,nontender, without masses, guarding or rebound.  Liver/spleen:  No organomegaly noted  Hernia:  None appreciated  Skin  Inspection:  Grossly normal   Breasts: Examined lying and sitting.     Right: Without masses, retractions, discharge or axillary adenopathy.     Left: Without masses, retractions, discharge or axillary adenopathy. Gentitourinary   Inguinal/mons:  Normal without inguinal adenopathy  External genitalia:  Normal  BUS/Urethra/Skene's glands:  Normal  Vagina:  Normal  Cervix:  Normal  Uterus:   normal in size, shape and contour.  Midline and mobile  Adnexa/parametria:     Rt: Without masses or tenderness.   Lt: Without masses or tenderness.  Anus and perineum: Normal  Digital rectal exam: Normal sphincter tone without palpated masses or tenderness  Assessment/Plan:  52 y.o. S WF G0 for annual exam with no complaints.  Postmenopausal on HRT with no bleeding. HSV 1 rare  outbreaks  Plan: Zovirax 200 mg 5 times daily.  as needed.  Gripping, proper use given and reviewed.  Estrace 0.5 mg p.o. daily Provera 10 mg p.o. day 1 through 12 of each month.  Prescription, proper use given and reviewed.  SBE's, continue annual screening mammogram, calcium rich foods, vitamin D 2000 daily encouraged.  Continue healthy lifestyle of regular exercise and healthy diet.  CBC, CMP, lipid panel, Pap with HR HPV typing, new screening guidelines reviewed.  Screening colonoscopy at age 53, [facility] information given.   Harrington Challenger [facility], 8:41 AM 07/05/2017

## 2017-07-07 ENCOUNTER — Other Ambulatory Visit: Payer: Self-pay

## 2017-07-07 DIAGNOSIS — B009 Herpesviral infection, unspecified: Secondary | ICD-10-CM

## 2017-07-07 MED ORDER — ACYCLOVIR 200 MG PO CAPS: ORAL_CAPSULE | ORAL | 2 refills | Status: DC

## 2017-07-10 LAB — PAP, TP IMAGING W/ HPV RNA, RFLX HPV TYPE 16,18/45: HPV DNA HIGH RISK: NOT DETECTED

## 2017-07-25 ENCOUNTER — Other Ambulatory Visit: Payer: Self-pay | Admitting: Women's Health

## 2017-08-01 ENCOUNTER — Ambulatory Visit
Admission: RE | Admit: 2017-08-01 | Discharge: 2017-08-01 | Disposition: A | Discharge status: Home / Self Care | Payer: 59 | Source: Ambulatory Visit | Attending: Women's Health | Admitting: Women's Health

## 2017-08-01 ENCOUNTER — Encounter: Payer: Self-pay | Admitting: Radiology

## 2017-08-01 DIAGNOSIS — Z1231 Encounter for screening mammogram for malignant neoplasm of breast: Secondary | ICD-10-CM

## 2017-09-16 ENCOUNTER — Other Ambulatory Visit: Payer: Self-pay | Admitting: Women's Health

## 2017-09-16 DIAGNOSIS — Z79890 Hormone replacement therapy (postmenopausal): Secondary | ICD-10-CM

## 2017-10-31 ENCOUNTER — Other Ambulatory Visit: Payer: Self-pay

## 2017-10-31 DIAGNOSIS — N946 Dysmenorrhea, unspecified: Secondary | ICD-10-CM

## 2017-10-31 MED ORDER — IBUPROFEN 600 MG PO TABS: 600.0000 mg | ORAL_TABLET | Freq: Three times a day (TID) | ORAL | 1 refills | Status: DC | PRN

## 2017-12-08 ENCOUNTER — Other Ambulatory Visit: Payer: Self-pay | Admitting: Women's Health

## 2018-03-08 ENCOUNTER — Other Ambulatory Visit: Payer: Self-pay | Admitting: *Deleted

## 2018-03-08 DIAGNOSIS — B009 Herpesviral infection, unspecified: Secondary | ICD-10-CM

## 2018-03-08 MED ORDER — ACYCLOVIR 200 MG PO CAPS: ORAL_CAPSULE | ORAL | 1 refills | Status: DC

## 2018-07-16 ENCOUNTER — Other Ambulatory Visit: Payer: Self-pay

## 2018-07-17 ENCOUNTER — Ambulatory Visit (INDEPENDENT_AMBULATORY_CARE_PROVIDER_SITE_OTHER): Payer: 59 | Admitting: Women's Health

## 2018-07-17 ENCOUNTER — Encounter: Payer: Self-pay | Admitting: Women's Health

## 2018-07-17 VITALS — BP 110/80 | Ht 71.0 in | Wt 168.0 lb

## 2018-07-17 DIAGNOSIS — B009 Herpesviral infection, unspecified: Secondary | ICD-10-CM | POA: Diagnosis not present

## 2018-07-17 DIAGNOSIS — Z79890 Hormone replacement therapy (postmenopausal): Secondary | ICD-10-CM | POA: Diagnosis not present

## 2018-07-17 DIAGNOSIS — Z13220 Encounter for screening for lipoid disorders: Secondary | ICD-10-CM | POA: Diagnosis not present

## 2018-07-17 DIAGNOSIS — E559 Vitamin D deficiency, unspecified: Secondary | ICD-10-CM

## 2018-07-17 DIAGNOSIS — Z01419 Encounter for gynecological examination (general) (routine) without abnormal findings: Secondary | ICD-10-CM | POA: Diagnosis not present

## 2018-07-17 MED ORDER — ACYCLOVIR 200 MG PO CAPS: ORAL_CAPSULE | ORAL | 1 refills | Status: DC

## 2018-07-17 MED ORDER — ESTRADIOL 0.5 MG PO TABS: 0.5000 mg | ORAL_TABLET | Freq: Every day | ORAL | 4 refills | Status: DC

## 2018-07-17 MED ORDER — PROGESTERONE MICRONIZED 100 MG PO CAPS: ORAL_CAPSULE | ORAL | 4 refills | Status: DC

## 2018-07-17 NOTE — Patient Instructions (Addendum)
Colonoscopy  [facility]  Dr Gessner  [phone]   Health Maintenance, Female Adopting a healthy lifestyle and getting preventive care can go a long way to promote health and wellness. Talk with your health care provider about what schedule of regular examinations is right for you. This is a good chance for you to check in with your provider about disease prevention and staying healthy. In between checkups, there are plenty of things you can do on your own. Experts have done a lot of research about which lifestyle changes and preventive measures are most likely to keep you healthy. Ask your health care provider for more information. Weight and diet Eat a healthy diet  Be sure to include plenty of vegetables, fruits, low-fat dairy products, and lean protein.  Do not eat a lot of foods high in solid fats, added sugars, or salt.  Get regular exercise. This is one of the most important things you can do for your health. ? Most adults should exercise for at least 150 minutes each week. The exercise should increase your heart rate and make you sweat (moderate-intensity exercise). ? Most adults should also do strengthening exercises at least twice a week. This is in addition to the moderate-intensity exercise. Maintain a healthy weight  Body mass index (BMI) is a measurement that can be used to identify possible weight problems. It estimates body fat based on height and weight. Your health care provider can help determine your BMI and help you achieve or maintain a healthy weight.  For females 20 years of age and older: ? A BMI below 18.5 is considered underweight. ? A BMI of 18.5 to 24.9 is normal. ? A BMI of 25 to 29.9 is considered overweight. ? A BMI of 30 and above is considered obese. Watch levels of cholesterol and blood lipids  You should start having your blood tested for lipids and cholesterol at 51 years of age, then have this test every 5 years.  You may need to have your cholesterol  levels checked more often if: ? Your lipid or cholesterol levels are high. ? You are older than 50 years of age. ? You are at high risk for heart disease. Cancer screening Lung Cancer  Lung cancer screening is recommended for adults 55-80 years old who are at high risk for lung cancer because of a history of smoking.  A yearly low-dose CT scan of the lungs is recommended for people who: ? Currently smoke. ? Have quit within the past 15 years. ? Have at least a 30-pack-year history of smoking. A pack year is smoking an average of one pack of cigarettes a day for 1 year.  Yearly screening should continue until it has been 15 years since you quit.  Yearly screening should stop if you develop a health problem that would prevent you from having lung cancer treatment. Breast Cancer  Practice breast self-awareness. This means understanding how your breasts normally appear and feel.  It also means doing regular breast self-exams. Let your health care provider know about any changes, no matter how small.  If you are in your 20s or 30s, you should have a clinical breast exam (CBE) by a health care provider every 1-3 years as part of a regular health exam.  If you are 40 or older, have a CBE every year. Also consider having a breast X-ray (mammogram) every year.  If you have a family history of breast cancer, talk to your health care provider about genetic screening.    If you are at high risk for breast cancer, talk to your health care provider about having an MRI and a mammogram every year.  Breast cancer gene (BRCA) assessment is recommended for women who have family members with BRCA-related cancers. BRCA-related cancers include: ? Breast. ? Ovarian. ? Tubal. ? Peritoneal cancers.  Results of the assessment will determine the need for genetic counseling and BRCA1 and BRCA2 testing. Cervical Cancer Your health care provider may recommend that you be screened regularly for cancer of the  pelvic organs (ovaries, uterus, and vagina). This screening involves a pelvic examination, including checking for microscopic changes to the surface of your cervix (Pap test). You may be encouraged to have this screening done every 3 years, beginning at age 29.  For women ages 73-65, health care providers may recommend pelvic exams and Pap testing every 3 years, or they may recommend the Pap and pelvic exam, combined with testing for human papilloma virus (HPV), every 5 years. Some types of HPV increase your risk of cervical cancer. Testing for HPV may also be done on women of any age with unclear Pap test results.  Other health care providers may not recommend any screening for nonpregnant women who are considered low risk for pelvic cancer and who do not have symptoms. Ask your health care provider if a screening pelvic exam is right for you.  If you have had past treatment for cervical cancer or a condition that could lead to cancer, you need Pap tests and screening for cancer for at least 20 years after your treatment. If Pap tests have been discontinued, your risk factors (such as having a new sexual partner) need to be reassessed to determine if screening should resume. Some women have medical problems that increase the chance of getting cervical cancer. In these cases, your health care provider may recommend more frequent screening and Pap tests. Colorectal Cancer  This type of cancer can be detected and often prevented.  Routine colorectal cancer screening usually begins at 51 years of age and continues through 51 years of age.  Your health care provider may recommend screening at an earlier age if you have risk factors for colon cancer.  Your health care provider may also recommend using home test kits to check for hidden blood in the stool.  A small camera at the end of a tube can be used to examine your colon directly (sigmoidoscopy or colonoscopy). This is done to check for the earliest  forms of colorectal cancer.  Routine screening usually begins at age 32.  Direct examination of the colon should be repeated every 5-10 years through 51 years of age. However, you may need to be screened more often if early forms of precancerous polyps or small growths are found. Skin Cancer  Check your skin from head to toe regularly.  Tell your health care provider about any new moles or changes in moles, especially if there is a change in a mole's shape or color.  Also tell your health care provider if you have a mole that is larger than the size of a pencil eraser.  Always use sunscreen. Apply sunscreen liberally and repeatedly throughout the day.  Protect yourself by wearing long sleeves, pants, a wide-brimmed hat, and sunglasses whenever you are outside. Heart disease, diabetes, and high blood pressure  High blood pressure causes heart disease and increases the risk of stroke. High blood pressure is more likely to develop in: ? People who have blood pressure in the high  end of the normal range (130-139/85-89 mm Hg). ? People who are overweight or obese. ? People who are African American.  If you are 80-42 years of age, have your blood pressure checked every 3-5 years. If you are 47 years of age or older, have your blood pressure checked every year. You should have your blood pressure measured twice-once when you are at a hospital or clinic, and once when you are not at a hospital or clinic. Record the average of the two measurements. To check your blood pressure when you are not at a hospital or clinic, you can use: ? An automated blood pressure machine at a pharmacy. ? A home blood pressure monitor.  If you are between 49 years and 75 years old, ask your health care provider if you should take aspirin to prevent strokes.  Have regular diabetes screenings. This involves taking a blood sample to check your fasting blood sugar level. ? If you are at a normal weight and have a low  risk for diabetes, have this test once every three years after 51 years of age. ? If you are overweight and have a high risk for diabetes, consider being tested at a younger age or more often. Preventing infection Hepatitis B  If you have a higher risk for hepatitis B, you should be screened for this virus. You are considered at high risk for hepatitis B if: ? You were born in a country where hepatitis B is common. Ask your health care provider which countries are considered high risk. ? Your parents were born in a high-risk country, and you have not been immunized against hepatitis B (hepatitis B vaccine). ? You have HIV or AIDS. ? You use needles to inject street drugs. ? You live with someone who has hepatitis B. ? You have had sex with someone who has hepatitis B. ? You get hemodialysis treatment. ? You take certain medicines for conditions, including cancer, organ transplantation, and autoimmune conditions. Hepatitis C  Blood testing is recommended for: ? Everyone born from [birth year] through 1963. ? Anyone with known risk factors for hepatitis C. Sexually transmitted infections (STIs)  You should be screened for sexually transmitted infections (STIs) including gonorrhea and chlamydia if: ? You are sexually active and are younger than 51 years of age. ? You are older than 51 years of age and your health care provider tells you that you are at risk for this type of infection. ? Your sexual activity has changed since you were last screened and you are at an increased risk for chlamydia or gonorrhea. Ask your health care provider if you are at risk.  If you do not have HIV, but are at risk, it may be recommended that you take a prescription medicine daily to prevent HIV infection. This is called pre-exposure prophylaxis (PrEP). You are considered at risk if: ? You are sexually active and do not regularly use condoms or know the HIV status of your partner(s). ? You take drugs by  injection. ? You are sexually active with a partner who has HIV. Talk with your health care provider about whether you are at high risk of being infected with HIV. If you choose to begin PrEP, you should first be tested for HIV. You should then be tested every 3 months for as long as you are taking PrEP. Pregnancy  If you are premenopausal and you may become pregnant, ask your health care provider about preconception counseling.  If you may become pregnant,  take 400 to 800 micrograms (mcg) of folic acid every day.  If you want to prevent pregnancy, talk to your health care provider about birth control (contraception). Osteoporosis and menopause  Osteoporosis is a disease in which the bones lose minerals and strength with aging. This can result in serious bone fractures. Your risk for osteoporosis can be identified using a bone density scan.  If you are 30 years of age or older, or if you are at risk for osteoporosis and fractures, ask your health care provider if you should be screened.  Ask your health care provider whether you should take a calcium or vitamin D supplement to lower your risk for osteoporosis.  Menopause may have certain physical symptoms and risks.  Hormone replacement therapy may reduce some of these symptoms and risks. Talk to your health care provider about whether hormone replacement therapy is right for you. Follow these instructions at home:  Schedule regular health, dental, and eye exams.  Stay current with your immunizations.  Do not use any tobacco products including cigarettes, chewing tobacco, or electronic cigarettes.  If you are pregnant, do not drink alcohol.  If you are breastfeeding, limit how much and how often you drink alcohol.  Limit alcohol intake to no more than 1 drink per day for nonpregnant women. One drink equals 12 ounces of beer, 5 ounces of wine, or 1 ounces of hard liquor.  Do not use street drugs.  Do not share needles.  Ask  your health care provider for help if you need support or information about quitting drugs.  Tell your health care provider if you often feel depressed.  Tell your health care provider if you have ever been abused or do not feel safe at home. This information is not intended to replace advice given to you by your health care provider. Make sure you discuss any questions you have with your health care provider. Document Released: 08/30/2010 Document Revised: 07/23/2015 Document Reviewed: 11/18/2014 Elsevier Interactive Patient Education  2019 [employer].

## 2018-07-17 NOTE — Progress Notes (Signed)
Diana Vazquez December 05, 1965 [id number]    History:    Presents for annual exam.  Postmenopausal on cyclic HRT with occasional spotting after Provera.    History of BTL.  Normal Pap and mammogram history.  Not sexually active, denies need for STD screen.  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  Past medical history, past surgical history, family history and social history were all reviewed and documented in the EPIC chart.  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  ROS:  A ROS was performed and pertinent positives and negatives are included.  Exam:  Vitals:   07/17/18 0813  BP: 110/80  Weight: 168 lb (76.2 kg)  Height: 5\' 11"  (1.803 m)   Body mass index is 23.43 kg/m.   General appearance:  Normal Thyroid:  Symmetrical, normal in size, without palpable masses or nodularity. Respiratory  Auscultation:  Clear without wheezing or rhonchi Cardiovascular  Auscultation:  Regular rate, without rubs, murmurs or gallops  Edema/varicosities:  Not grossly evident Abdominal  Soft,nontender, without masses, guarding or rebound.  Liver/spleen:  No organomegaly noted  Hernia:  None appreciated  Skin  Inspection:  Grossly normal   Breasts: Examined lying and sitting.     Right: Without masses, retractions, discharge or axillary adenopathy.     Left: Without masses, retractions, discharge or axillary adenopathy. Gentitourinary   Inguinal/mons:  Normal without inguinal adenopathy  External genitalia:  Normal  BUS/Urethra/Skene's glands:  Normal  Vagina:  Normal  Cervix:  Normal  Uterus:  normal in size, shape and contour.  Midline and mobile  Adnexa/parametria:     Rt: Without masses or tenderness.   Lt: Without masses or tenderness.  Anus and perineum: Normal  Digital rectal exam: Normal sphincter tone without palpated masses or tenderness  Assessment/Plan:  53 y.o. S WF G0 for annual exam with no complaints.  Postmenopausal on cyclic HRT with good relief of symptoms with occasional bleeding HSV 1  rare outbreaks  Plan: HRT reviewed risks of blood clots, strokes and breast cancer, states has felt better on and would like to continue, Prometrium 100 mg p.o. daily at bedtime, estradiol 0.5 mg p.o. daily.  Instructed to call if continued spotting or bleeding.  SBEs, continue annual screening mammogram, regular cardio exercise, calcium rich foods, vitamin D 2000 daily encouraged.  Weaning colonoscopy reviewed, [facility] information given instructed to schedule.  Acyclovir 200 mg 5 times daily as needed.  Prescription given.  CBC, CMP, lipid panel, vitamin D.  Pap normal 2019, new screening guidelines reviewed.   Harrington [facility], 8:37 AM 07/17/2018

## 2018-07-18 LAB — COMPREHENSIVE METABOLIC PANEL
AG Ratio: 1.6 (calc) (ref 1.0–2.5)
ALT: 13 U/L (ref 6–29)
AST: 16 U/L (ref 10–35)
Albumin: 4.2 g/dL (ref 3.6–5.1)
Alkaline phosphatase (APISO): 60 U/L (ref 37–153)
BUN: 14 mg/dL (ref 7–25)
CO2: 26 mmol/L (ref 20–32)
Calcium: 9.1 mg/dL (ref 8.6–10.4)
Chloride: 106 mmol/L (ref 98–110)
Creat: 0.8 mg/dL (ref 0.50–1.05)
Globulin: 2.7 g/dL (calc) (ref 1.9–3.7)
Glucose, Bld: 86 mg/dL (ref 65–99)
Potassium: 3.8 mmol/L (ref 3.5–5.3)
Sodium: 138 mmol/L (ref 135–146)
Total Bilirubin: 0.5 mg/dL (ref 0.2–1.2)
Total Protein: 6.9 g/dL (ref 6.1–8.1)

## 2018-07-18 LAB — CBC WITH DIFFERENTIAL/PLATELET
Absolute Monocytes: 466 cells/uL (ref 200–950)
Basophils Absolute: 42 cells/uL (ref 0–200)
Basophils Relative: 0.8 %
Eosinophils Absolute: 111 cells/uL (ref 15–500)
Eosinophils Relative: 2.1 %
HCT: 35.9 % (ref 35.0–45.0)
Hemoglobin: 12.3 g/dL (ref 11.7–15.5)
Lymphs Abs: 1060 cells/uL (ref 850–3900)
MCH: 32 pg (ref 27.0–33.0)
MCHC: 34.3 g/dL (ref 32.0–36.0)
MCV: 93.5 fL (ref 80.0–100.0)
MPV: 11.4 fL (ref 7.5–12.5)
Monocytes Relative: 8.8 %
Neutro Abs: 3620 cells/uL (ref 1500–7800)
Neutrophils Relative %: 68.3 %
Platelets: 229 10*3/uL (ref 140–400)
RBC: 3.84 10*6/uL (ref 3.80–5.10)
RDW: 12.5 % (ref 11.0–15.0)
Total Lymphocyte: 20 %
WBC: 5.3 10*3/uL (ref 3.8–10.8)

## 2018-07-18 LAB — LIPID PANEL
Cholesterol: 199 mg/dL (ref <200)
HDL: 53 mg/dL (ref >=50)
LDL Cholesterol (Calc): 127 mg/dL (calc) — ABNORMAL HIGH
Non-HDL Cholesterol (Calc): 146 mg/dL (calc) — ABNORMAL HIGH (ref <130)
Total CHOL/HDL Ratio: 3.8 (calc) (ref <5.0)
Triglycerides: 88 mg/dL (ref <150)

## 2018-07-18 LAB — VITAMIN D 25 HYDROXY (VIT D DEFICIENCY, FRACTURES): Vit D, 25-Hydroxy: 27 ng/mL — ABNORMAL LOW (ref 30–100)

## 2018-07-19 LAB — URINALYSIS, COMPLETE W/RFL CULTURE
Bacteria, UA: NONE SEEN /HPF
Bilirubin Urine: NEGATIVE
Glucose, UA: NEGATIVE
Hyaline Cast: NONE SEEN /LPF
Ketones, ur: NEGATIVE
Leukocyte Esterase: NEGATIVE
Nitrites, Initial: NEGATIVE
Protein, ur: NEGATIVE
Specific Gravity, Urine: 1.019 (ref 1.001–1.03)
pH: <=5 (ref 5.0–8.0)

## 2018-07-19 LAB — URINE CULTURE
MICRO NUMBER:: 491510
Result:: NO GROWTH
SPECIMEN QUALITY:: ADEQUATE

## 2018-07-19 LAB — CULTURE INDICATED

## 2018-12-09 ENCOUNTER — Other Ambulatory Visit: Payer: Self-pay | Admitting: Women's Health

## 2018-12-09 DIAGNOSIS — Z79890 Hormone replacement therapy (postmenopausal): Secondary | ICD-10-CM

## 2019-01-31 ENCOUNTER — Telehealth: Payer: Self-pay | Admitting: *Deleted

## 2019-01-31 DIAGNOSIS — B009 Herpesviral infection, unspecified: Secondary | ICD-10-CM

## 2019-01-31 NOTE — Telephone Encounter (Signed)
Patient called stating acyclovir price has increased at [facility]. Patient said medication was cheaper at [facility] will check to see if cost will be cheaper and call me back to let me know.

## 2019-02-11 MED ORDER — ACYCLOVIR 200 MG PO CAPS: ORAL_CAPSULE | ORAL | 1 refills | Status: DC

## 2019-02-11 NOTE — Addendum Note (Signed)
Addended by: Thamas Jaegers on: 02/11/2019 09:10 AM   Modules accepted: Orders

## 2019-02-11 NOTE — Telephone Encounter (Signed)
Patient called back and asked if Rx could be sent to [facility] Rx sent, patient said found a way to have it cheaper at [facility].

## 2019-02-15 ENCOUNTER — Other Ambulatory Visit: Payer: Self-pay | Admitting: Women's Health

## 2019-02-15 DIAGNOSIS — Z1231 Encounter for screening mammogram for malignant neoplasm of breast: Secondary | ICD-10-CM

## 2019-05-14 ENCOUNTER — Other Ambulatory Visit: Payer: Self-pay | Admitting: Women's Health

## 2019-05-14 DIAGNOSIS — N946 Dysmenorrhea, unspecified: Secondary | ICD-10-CM

## 2019-05-14 NOTE — Telephone Encounter (Signed)
Ok for refill? 

## 2019-06-04 ENCOUNTER — Telehealth: Payer: Self-pay | Admitting: *Deleted

## 2019-06-04 NOTE — Telephone Encounter (Signed)
Please call and review we do recommend the Covid vaccine, the shingles vaccine called Shingrix is a 2 series vaccine should wait at least 4 months prior to getting it after receiving the Covid vaccine.  Shingrix she can get it at pharmacy have her call her insurance company to make sure it is covered and if they have a preferred pharmacy.  Shingrix can be given anytime after age 52 and is about 90% effective for shingles prevention.

## 2019-06-04 NOTE — Telephone Encounter (Signed)
Patient called asking if you recommend she get the "new" shingles vaccine? I explained we do not give the vaccine at this office. Please advise

## 2019-06-04 NOTE — Telephone Encounter (Signed)
Patient informed. 

## 2019-06-04 NOTE — Telephone Encounter (Signed)
Patient called and left voicemail in triage requesting a call back with question about shingles and covid vaccine. I called patient, was not able to leave voicemail as her mailbox is full.

## 2019-07-01 ENCOUNTER — Ambulatory Visit
Admission: RE | Admit: 2019-07-01 | Discharge: 2019-07-01 | Disposition: A | Discharge status: Home / Self Care | Payer: 59 | Source: Ambulatory Visit | Attending: Women's Health | Admitting: Women's Health

## 2019-07-01 ENCOUNTER — Other Ambulatory Visit: Payer: Self-pay

## 2019-07-01 DIAGNOSIS — Z1231 Encounter for screening mammogram for malignant neoplasm of breast: Secondary | ICD-10-CM

## 2019-07-03 ENCOUNTER — Other Ambulatory Visit: Payer: Self-pay | Admitting: Women's Health

## 2019-07-03 DIAGNOSIS — Z1231 Encounter for screening mammogram for malignant neoplasm of breast: Secondary | ICD-10-CM

## 2019-07-08 ENCOUNTER — Other Ambulatory Visit: Payer: Self-pay

## 2019-07-09 ENCOUNTER — Encounter: Payer: Self-pay | Admitting: Nurse Practitioner

## 2019-07-09 ENCOUNTER — Ambulatory Visit (INDEPENDENT_AMBULATORY_CARE_PROVIDER_SITE_OTHER): Payer: 59 | Admitting: Nurse Practitioner

## 2019-07-09 VITALS — BP 110/80 | Ht 71.0 in | Wt 172.0 lb

## 2019-07-09 DIAGNOSIS — Z8349 Family history of other endocrine, nutritional and metabolic diseases: Secondary | ICD-10-CM

## 2019-07-09 DIAGNOSIS — Z13220 Encounter for screening for lipoid disorders: Secondary | ICD-10-CM | POA: Diagnosis not present

## 2019-07-09 DIAGNOSIS — E559 Vitamin D deficiency, unspecified: Secondary | ICD-10-CM | POA: Diagnosis not present

## 2019-07-09 DIAGNOSIS — Z01419 Encounter for gynecological examination (general) (routine) without abnormal findings: Secondary | ICD-10-CM | POA: Diagnosis not present

## 2019-07-09 DIAGNOSIS — Z833 Family history of diabetes mellitus: Secondary | ICD-10-CM

## 2019-07-09 NOTE — Progress Notes (Signed)
   Diana Vazquez 1965/03/23 [id number]   History:  54 y.o. SWF G0 presents for annual exam without GYN complaints. Postmenopausal on cyclic HRT with no BTB. History of HSV-1 with rare outbreaks, vitamin D deficiency. Normal pap and mammogram history.  Family history of thyroid disease and diabetes.  Not sexually active.  Taking 2000 units of vitamin D daily.  Received the first dose of Shingrix, received COVID vaccine.  Gynecologic History Patient's last menstrual period was 07/18/2018.   Contraception: post menopausal status and tubal ligation Last Pap: 07/05/17. Results were: normal Last mammogram: 07/01/19. Results were: normal Colonoscopy: never  Past medical history, past surgical history, family history and social history were all reviewed and documented in the EPIC chart.  ROS:  A ROS was performed and pertinent positives and negatives are included.  Exam:  Vitals:   07/09/19 0759  BP: 110/80  Weight: 172 lb (78 kg)  Height: 5\' 11"  (1.803 m)   Body mass index is 23.99 kg/m.  General appearance:  Normal Thyroid:  Symmetrical, normal in size, without palpable masses or nodularity. Respiratory  Auscultation:  Clear without wheezing or rhonchi Cardiovascular  Auscultation:  Regular rate, without rubs, murmurs or gallops  Edema/varicosities:  Not grossly evident Abdominal  Soft,nontender, without masses, guarding or rebound.  Liver/spleen:  No organomegaly noted  Hernia:  None appreciated  Skin  Inspection:  Grossly normal   Breasts: Examined lying and sitting.   Right: Without masses, retractions, discharge or axillary adenopathy.   Left: Without masses, retractions, discharge or axillary adenopathy. Gentitourinary   Inguinal/mons:  Normal without inguinal adenopathy  External genitalia:  Normal  BUS/Urethra/Skene's glands:  Normal  Vagina:  Normal  Cervix:  Normal  Uterus:  Anteverted, normal in size, shape and contour.  Midline and mobile  Adnexa/parametria:     Rt: Without masses or tenderness.   Lt: Without masses or tenderness.  Anus and perineum: Normal  Digital rectal exam: Normal sphincter tone without palpated masses or tenderness  Assessment/Plan:  54 y.o. SWF G0 for annual exam.   Well female exam with routine gynecological exam - Plan: Urinalysis,Complete w/RFL Culture, CBC with Differential/Platelet, Comprehensive metabolic panel. Education provided on SBEs, importance of preventative screenings, current guidelines, regular exercise, daily calcium and vitamin D supplement.   Need for colon cancer screening - will place GI referral   Vitamin D deficiency - Plan: Vitamin D (25 hydroxy), continue vitamin D 2000 units daily  Family history of thyroid disease - Plan: TSH  Lipid screening - Plan: Lipid panel  Family history of diabetes mellitus - Plan: Hemoglobin A1c  Follow up in 1 year for annual    Diana Vazquez [facility], 8:25 AM 07/09/2019

## 2019-07-09 NOTE — Patient Instructions (Addendum)
Health Maintenance, Female Adopting a healthy lifestyle and getting preventive care are important in promoting health and wellness. Ask your health care provider about:  The right schedule for you to have regular tests and exams.  Things you can do on your own to prevent diseases and keep yourself healthy. What should I know about diet, weight, and exercise? Eat a healthy diet   Eat a diet that includes plenty of vegetables, fruits, low-fat dairy products, and lean protein.  Do not eat a lot of foods that are high in solid fats, added sugars, or sodium. Maintain a healthy weight Body mass index (BMI) is used to identify weight problems. It estimates body fat based on height and weight. Your health care provider can help determine your BMI and help you achieve or maintain a healthy weight. Get regular exercise Get regular exercise. This is one of the most important things you can do for your health. Most adults should:  Exercise for at least 150 minutes each week. The exercise should increase your heart rate and make you sweat (moderate-intensity exercise).  Do strengthening exercises at least twice a week. This is in addition to the moderate-intensity exercise.  Spend less time sitting. Even light physical activity can be beneficial. Watch cholesterol and blood lipids Have your blood tested for lipids and cholesterol at 52 years of age, then have this test every 5 years. Have your cholesterol levels checked more often if:  Your lipid or cholesterol levels are high.  You are older than 52 years of age.  You are at high risk for heart disease. What should I know about cancer screening? Depending on your health history and family history, you may need to have cancer screening at various ages. This may include screening for:  Breast cancer.  Cervical cancer.  Colorectal cancer.  Skin cancer.  Lung cancer. What should I know about heart disease, diabetes, and high blood  pressure? Blood pressure and heart disease  High blood pressure causes heart disease and increases the risk of stroke. This is more likely to develop in people who have high blood pressure readings, are of African descent, or are overweight.  Have your blood pressure checked: ? Every 3-5 years if you are 18-39 years of age. ? Every year if you are 40 years old or older. Diabetes Have regular diabetes screenings. This checks your fasting blood sugar level. Have the screening done:  Once every three years after age 40 if you are at a normal weight and have a low risk for diabetes.  More often and at a younger age if you are overweight or have a high risk for diabetes. What should I know about preventing infection? Hepatitis B If you have a higher risk for hepatitis B, you should be screened for this virus. Talk with your health care provider to find out if you are at risk for hepatitis B infection. Hepatitis C Testing is recommended for:  Everyone born from 1945 through 1965.  Anyone with known risk factors for hepatitis C. Sexually transmitted infections (STIs)  Get screened for STIs, including gonorrhea and chlamydia, if: ? You are sexually active and are younger than 52 years of age. ? You are older than 52 years of age and your health care provider tells you that you are at risk for this type of infection. ? Your sexual activity has changed since you were last screened, and you are at increased risk for chlamydia or gonorrhea. Ask your health care provider if   you are at risk.  Ask your health care provider about whether you are at high risk for HIV. Your health care provider may recommend a prescription medicine to help prevent HIV infection. If you choose to take medicine to prevent HIV, you should first get tested for HIV. You should then be tested every 3 months for as long as you are taking the medicine. Pregnancy  If you are about to stop having your period (premenopausal) and  you may become pregnant, seek counseling before you get pregnant.  Take 400 to 800 micrograms (mcg) of folic acid every day if you become pregnant.  Ask for birth control (contraception) if you want to prevent pregnancy. Osteoporosis and menopause Osteoporosis is a disease in which the bones lose minerals and strength with aging. This can result in bone fractures. If you are 65 years old or older, or if you are at risk for osteoporosis and fractures, ask your health care provider if you should:  Be screened for bone loss.  Take a calcium or vitamin D supplement to lower your risk of fractures.  Be given hormone replacement therapy (HRT) to treat symptoms of menopause. Follow these instructions at home: Lifestyle  Do not use any products that contain nicotine or tobacco, such as cigarettes, e-cigarettes, and chewing tobacco. If you need help quitting, ask your health care provider.  Do not use street drugs.  Do not share needles.  Ask your health care provider for help if you need support or information about quitting drugs. Alcohol use  Do not drink alcohol if: ? Your health care provider tells you not to drink. ? You are pregnant, may be pregnant, or are planning to become pregnant.  If you drink alcohol: ? Limit how much you use to 0-1 drink a day. ? Limit intake if you are breastfeeding.  Be aware of how much alcohol is in your drink. In the [place] one drink equals one 12 oz bottle of beer (355 mL), one 5 oz glass of wine (148 mL), or one 1 oz glass of hard liquor (44 mL). General instructions  Schedule regular health, dental, and eye exams.  Stay current with your vaccines.  Tell your health care provider if: ? You often feel depressed. ? You have ever been abused or do not feel safe at home. Summary  Adopting a healthy lifestyle and getting preventive care are important in promoting health and wellness.  Follow your health care provider's instructions about healthy  diet, exercising, and getting tested or screened for diseases.  Follow your health care provider's instructions on monitoring your cholesterol and blood pressure. This information is not intended to replace advice given to you by your health care provider. Make sure you discuss any questions you have with your health care provider. Document Revised: 02/07/2018 Document Reviewed: 02/07/2018 Elsevier Patient Education  2020 Elsevier Inc.  Menopause Menopause is the normal time of life when menstrual periods stop completely. It is usually confirmed by 12 months without a menstrual period. The transition to menopause (perimenopause) most often happens between the ages of 45 and 55. During perimenopause, hormone levels change in your body, which can cause symptoms and affect your health. Menopause may increase your risk for:  Loss of bone (osteoporosis), which causes bone breaks (fractures).  Depression.  Hardening and narrowing of the arteries (atherosclerosis), which can cause heart attacks and strokes. What are the causes? This condition is usually caused by a natural change in hormone levels that happens as you   get older. The condition may also be caused by surgery to remove both ovaries (bilateral oophorectomy). What increases the risk? This condition is more likely to start at an earlier age if you have certain medical conditions or treatments, including:  A tumor of the pituitary gland in the brain.  A disease that affects the ovaries and hormone production.  Radiation treatment for cancer.  Certain cancer treatments, such as chemotherapy or hormone (anti-estrogen) therapy.  Heavy smoking and excessive alcohol use.  Family history of early menopause. This condition is also more likely to develop earlier in women who are very thin. What are the signs or symptoms? Symptoms of this condition include:  Hot flashes.  Irregular menstrual periods.  Night sweats.  Changes in  feelings about sex. This could be a decrease in sex drive or an increased comfort around your sexuality.  Vaginal dryness and thinning of the vaginal walls. This may cause painful intercourse.  Dryness of the skin and development of wrinkles.  Headaches.  Problems sleeping (insomnia).  Mood swings or irritability.  Memory problems.  Weight gain.  Hair growth on the face and chest.  Bladder infections or problems with urinating. How is this diagnosed? This condition is diagnosed based on your medical history, a physical exam, your age, your menstrual history, and your symptoms. Hormone tests may also be done. How is this treated? In some cases, no treatment is needed. You and your health care provider should make a decision together about whether treatment is necessary. Treatment will be based on your individual condition and preferences. Treatment for this condition focuses on managing symptoms. Treatment may include:  Menopausal hormone therapy (MHT).  Medicines to treat specific symptoms or complications.  Acupuncture.  Vitamin or herbal supplements. Before starting treatment, make sure to let your health care provider know if you have a personal or family history of:  Heart disease.  Breast cancer.  Blood clots.  Diabetes.  Osteoporosis. Follow these instructions at home: Lifestyle  Do not use any products that contain nicotine or tobacco, such as cigarettes and e-cigarettes. If you need help quitting, ask your health care provider.  Get at least 30 minutes of physical activity on 5 or more days each week.  Avoid alcoholic and caffeinated beverages, as well as spicy foods. This may help prevent hot flashes.  Get 7-8 hours of sleep each night.  If you have hot flashes, try: ? Dressing in layers. ? Avoiding things that may trigger hot flashes, such as spicy food, warm places, or stress. ? Taking slow, deep breaths when a hot flash starts. ? Keeping a fan in  your home and office.  Find ways to manage stress, such as deep breathing, meditation, or journaling.  Consider going to group therapy with other women who are having menopause symptoms. Ask your health care provider about recommended group therapy meetings. Eating and drinking  Eat a healthy, balanced diet that contains whole grains, lean protein, low-fat dairy, and plenty of fruits and vegetables.  Your health care provider may recommend adding more soy to your diet. Foods that contain soy include tofu, tempeh, and soy milk.  Eat plenty of foods that contain calcium and vitamin D for bone health. Items that are rich in calcium include low-fat milk, yogurt, beans, almonds, sardines, broccoli, and kale. Medicines  Take over-the-counter and prescription medicines only as told by your health care provider.  Talk with your health care provider before starting any herbal supplements. If prescribed, take vitamins and supplements   as told by your health care provider. These may include: ? Calcium. Women age 51 and older should get 1,200 mg (milligrams) of calcium every day. ? Vitamin D. Women need 600-800 International Units of vitamin D each day. ? Vitamins B12 and B6. Aim for 50 micrograms of B12 and 1.5 mg of B6 each day. General instructions  Keep track of your menstrual periods, including: ? When they occur. ? How heavy they are and how long they last. ? How much time passes between periods.  Keep track of your symptoms, noting when they start, how often you have them, and how long they last.  Use vaginal lubricants or moisturizers to help with vaginal dryness and improve comfort during sex.  Keep all follow-up visits as told by your health care provider. This is important. This includes any group therapy or counseling. Contact a health care provider if:  You are still having menstrual periods after age 55.  You have pain during sex.  You have not had a period for 12 months and  you develop vaginal bleeding. Get help right away if:  You have: ? Severe depression. ? Excessive vaginal bleeding. ? Pain when you urinate. ? A fast or irregular heart beat (palpitations). ? Severe headaches. ? Abdomen (abdominal) pain or severe indigestion.  You fell and you think you have a broken bone.  You develop leg or chest pain.  You develop vision problems.  You feel a lump in your breast. Summary  Menopause is the normal time of life when menstrual periods stop completely. It is usually confirmed by 12 months without a menstrual period.  The transition to menopause (perimenopause) most often happens between the ages of 45 and 55.  Symptoms can be managed through medicines, lifestyle changes, and complementary therapies such as acupuncture.  Eat a balanced diet that is rich in nutrients to promote bone health and heart health and to manage symptoms during menopause. This information is not intended to replace advice given to you by your health care provider. Make sure you discuss any questions you have with your health care provider. Document Revised: 01/27/2017 Document Reviewed: 03/19/2016 Elsevier Patient Education  2020 Elsevier Inc.  

## 2019-07-10 ENCOUNTER — Encounter: Payer: Self-pay | Admitting: *Deleted

## 2019-07-10 LAB — CBC WITH DIFFERENTIAL/PLATELET
Absolute Monocytes: 470 cells/uL (ref 200–950)
Basophils Absolute: 70 cells/uL (ref 0–200)
Basophils Relative: 1.2 %
Eosinophils Absolute: 99 cells/uL (ref 15–500)
Eosinophils Relative: 1.7 %
HCT: 36.6 % (ref 35.0–45.0)
Hemoglobin: 12.2 g/dL (ref 11.7–15.5)
Lymphs Abs: 1108 cells/uL (ref 850–3900)
MCH: 31.6 pg (ref 27.0–33.0)
MCHC: 33.3 g/dL (ref 32.0–36.0)
MCV: 94.8 fL (ref 80.0–100.0)
MPV: 11.1 fL (ref 7.5–12.5)
Monocytes Relative: 8.1 %
Neutro Abs: 4054 cells/uL (ref 1500–7800)
Neutrophils Relative %: 69.9 %
Platelets: 216 10*3/uL (ref 140–400)
RBC: 3.86 10*6/uL (ref 3.80–5.10)
RDW: 12.3 % (ref 11.0–15.0)
Total Lymphocyte: 19.1 %
WBC: 5.8 10*3/uL (ref 3.8–10.8)

## 2019-07-10 LAB — COMPREHENSIVE METABOLIC PANEL
AG Ratio: 1.4 (calc) (ref 1.0–2.5)
ALT: 9 U/L (ref 6–29)
AST: 13 U/L (ref 10–35)
Albumin: 3.9 g/dL (ref 3.6–5.1)
Alkaline phosphatase (APISO): 69 U/L (ref 37–153)
BUN: 10 mg/dL (ref 7–25)
CO2: 26 mmol/L (ref 20–32)
Calcium: 8.9 mg/dL (ref 8.6–10.4)
Chloride: 105 mmol/L (ref 98–110)
Creat: 0.91 mg/dL (ref 0.50–1.05)
Globulin: 2.7 g/dL (calc) (ref 1.9–3.7)
Glucose, Bld: 83 mg/dL (ref 65–99)
Potassium: 4.1 mmol/L (ref 3.5–5.3)
Sodium: 138 mmol/L (ref 135–146)
Total Bilirubin: 0.6 mg/dL (ref 0.2–1.2)
Total Protein: 6.6 g/dL (ref 6.1–8.1)

## 2019-07-10 LAB — LIPID PANEL
Cholesterol: 179 mg/dL (ref <200)
HDL: 49 mg/dL — ABNORMAL LOW (ref >=50)
LDL Cholesterol (Calc): 111 mg/dL (calc) — ABNORMAL HIGH
Non-HDL Cholesterol (Calc): 130 mg/dL (calc) — ABNORMAL HIGH (ref <130)
Total CHOL/HDL Ratio: 3.7 (calc) (ref <5.0)
Triglycerides: 89 mg/dL (ref <150)

## 2019-07-10 LAB — VITAMIN D 25 HYDROXY (VIT D DEFICIENCY, FRACTURES): Vit D, 25-Hydroxy: 52 ng/mL (ref 30–100)

## 2019-07-10 LAB — TSH: TSH: 2.78 mIU/L

## 2019-07-10 LAB — HEMOGLOBIN A1C
Hgb A1c MFr Bld: 5.4 % of total Hgb (ref <5.7)
Mean Plasma Glucose: 108 (calc)
eAG (mmol/L): 6 (calc)

## 2019-07-11 LAB — URINE CULTURE
MICRO NUMBER:: 10465631
Result:: NO GROWTH
SPECIMEN QUALITY:: ADEQUATE

## 2019-07-11 LAB — URINALYSIS, COMPLETE W/RFL CULTURE
Bacteria, UA: NONE SEEN /HPF
Bilirubin Urine: NEGATIVE
Glucose, UA: NEGATIVE
Hyaline Cast: NONE SEEN /LPF
Ketones, ur: NEGATIVE
Nitrites, Initial: NEGATIVE
Protein, ur: NEGATIVE
RBC / HPF: NONE SEEN /HPF (ref 0–2)
Specific Gravity, Urine: 1.009 (ref 1.001–1.03)
Squamous Epithelial / LPF: NONE SEEN /HPF (ref <=5)
WBC, UA: NONE SEEN /HPF (ref 0–5)
pH: 6.5 (ref 5.0–8.0)

## 2019-07-11 LAB — CULTURE INDICATED

## 2019-07-25 ENCOUNTER — Telehealth: Payer: Self-pay

## 2019-07-25 NOTE — Telephone Encounter (Signed)
Patient contacted [place] through My Chart. Diana Vazquez wrote her back there and let her know records have been faxed.

## 2019-07-25 NOTE — Telephone Encounter (Signed)
Patient said she called last week and asked for medical records to be faxed to [facility]. She wants to be sure that was done as her visit will be on Tuesday.

## 2019-09-09 ENCOUNTER — Other Ambulatory Visit: Payer: Self-pay

## 2019-09-09 DIAGNOSIS — Z79890 Hormone replacement therapy (postmenopausal): Secondary | ICD-10-CM

## 2019-09-09 MED ORDER — ESTRADIOL 0.5 MG PO TABS: 0.5000 mg | ORAL_TABLET | Freq: Every day | ORAL | 3 refills | Status: DC

## 2019-09-09 MED ORDER — PROGESTERONE MICRONIZED 100 MG PO CAPS: 100.0000 mg | ORAL_CAPSULE | Freq: Every day | ORAL | 3 refills | Status: DC

## 2019-09-09 NOTE — Telephone Encounter (Signed)
Pharmacy faxed requesting refills.

## 2019-09-11 ENCOUNTER — Other Ambulatory Visit: Payer: Self-pay | Admitting: *Deleted

## 2019-09-11 DIAGNOSIS — Z79890 Hormone replacement therapy (postmenopausal): Secondary | ICD-10-CM

## 2019-09-11 MED ORDER — PROGESTERONE MICRONIZED 100 MG PO CAPS: 100.0000 mg | ORAL_CAPSULE | Freq: Every day | ORAL | 3 refills | Status: DC

## 2019-09-11 MED ORDER — ESTRADIOL 0.5 MG PO TABS: 0.5000 mg | ORAL_TABLET | Freq: Every day | ORAL | 3 refills | Status: DC

## 2019-10-12 ENCOUNTER — Encounter (HOSPITAL_COMMUNITY): Payer: Self-pay

## 2019-10-12 ENCOUNTER — Ambulatory Visit (HOSPITAL_COMMUNITY)
Admission: EM | Admit: 2019-10-12 | Discharge: 2019-10-12 | Disposition: A | Discharge status: Home / Self Care | Payer: 59

## 2019-10-12 DIAGNOSIS — H01001 Unspecified blepharitis right upper eyelid: Secondary | ICD-10-CM

## 2019-10-12 MED ORDER — ERYTHROMYCIN 5 MG/GM OP OINT
TOPICAL_OINTMENT | OPHTHALMIC | 0 refills | Status: DC
Start: 2019-10-12 — End: 2019-10-13

## 2019-10-12 NOTE — Discharge Instructions (Signed)
Try the ointment on the upper eyelid  Continue warm and cool compresses  Follow up with your primary care to ensure improvement  If worsening prior, return for re-evaluation

## 2019-10-12 NOTE — ED Provider Notes (Signed)
[facility]    CSN: [id number] Arrival date & time: 10/12/19  1616      History   Chief Complaint Chief Complaint  Patient presents with  . eye irritation    HPI Diana Vazquez is a 54 y.o. female.   She reports urgent care for evaluation of right eyelid irritation and redness.  She reports this has been going on for the last several days.  She reports redness, irritation and some slight scale of the right upper eyelid.  She reports it is itchy at times.  She reports occasionally there being some swelling of the right eyelid.  She reports her vision is normal.  Denies eye pain itself.  No sensitivity to light.  Does not believe she got anything in the eye.  There is been no drainage or discharge.  She has been utilizing warm and cool compresses and this seems to help with regard to itching and some swelling she reports will be present in the morning.     Past Medical History:  Diagnosis Date  . Acne     Patient Active Problem List   Diagnosis Date Noted  . Vitamin D deficiency 07/09/2019  . History of bilateral tubal ligation 06/19/2015  . 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 TUBAL LIGATION  1995    OB History    Gravida  0   Para  0   Term  0   Preterm  0   AB  0   Living  0     SAB  0   TAB  0   Ectopic  0   Multiple  0   Live Births  0            Home Medications    Prior to Admission medications   Medication Sig Start Date End Date Taking? Authorizing Provider  Dapsone (ACZONE) 7.5 % GEL Apply topically.   Yes [provider]  metroNIDAZOLE (METROCREAM) 0.75 % cream Apply topically 2 (two) times daily.   Yes [provider]  Sulfacetamide Sodium-Sulfur 10-5 % CREA Apply topically.   Yes [provider]  acyclovir (ZOVIRAX) 200 MG capsule Take one daily to prevent. If feel symptoms take 2 daily until subsides. 02/11/19   Harrington Challenger, NP  calcium  carbonate (OS-CAL - DOSED IN MG OF ELEMENTAL CALCIUM) 1250 MG tablet Take 1 tablet by mouth daily.      [provider]  Calcium-Magnesium (CAL-MAG PO) Take 2 capsules by mouth daily.    [provider]  Cholecalciferol (VITAMIN D3 PO) Take 1 tablet by mouth daily.    [provider]  erythromycin ophthalmic ointment Place a 1/2 inch ribbon of ointment into the lower eyelid. 10/12/19   Aunica Dauphinee, Veryl Speak, PA-C  estradiol (ESTRACE) 0.5 MG tablet Take 1 tablet (0.5 mg total) by mouth daily. 09/11/19   Wyline Beady A, NP  ferrous sulfate 325 (65 FE) MG tablet Take 325 mg by mouth daily with breakfast.      [provider]  ibuprofen (ADVIL) 600 MG tablet TAKE 1 TABLET BY MOUTH  EVERY 8 HOURS AS NEEDED 05/14/19   Harrington Challenger, NP  multivitamin [facility]) per tablet Take 1 tablet by mouth daily.      [provider]  progesterone (PROMETRIUM) 100 MG capsule Take 1 capsule (100 mg total) by mouth at bedtime. 09/11/19   Olivia Mackie, NP  spironolactone (ALDACTONE) 25 MG  tablet Take 25 mg by mouth daily.    [provider]    Family History Family History  Problem Relation Age of Onset  . Diabetes Mother   . Diabetes Maternal Grandmother   . Heart disease Paternal Grandfather   . Breast cancer Neg Hx     Social History Social History   Tobacco Use  . Smoking status: Never Smoker  . Smokeless tobacco: Never Used  Vaping Use  . Vaping Use: Never used  Substance Use Topics  . Alcohol use: No    Alcohol/week: 0.0 standard drinks  . Drug use: No     Allergies   Patient has no known allergies.   Review of Systems Review of Systems   Physical Exam Triage Vital Signs ED Triage Vitals  Enc Vitals Group     BP 10/12/19 1718 117/65     Pulse Rate 10/12/19 1718 71     Resp 10/12/19 1718 16     Temp 10/12/19 1718 97.9 F (36.6 C)     Temp Source 10/12/19 1718 Oral     SpO2 10/12/19 1718 100 %     Weight 10/12/19 1720 170 lb  (77.1 kg)     Height 10/12/19 1720 5\' 11"  (1.803 m)     Head Circumference --      Peak Flow --      Pain Score 10/12/19 1720 1     Pain Loc --      Pain Edu? --      Excl. in GC? --    No data found.  Updated Vital Signs BP 117/65   Pulse 71   Temp 97.9 F (36.6 C) (Oral)   Resp 16   Ht 5\' 11"  (1.803 m)   Wt 170 lb (77.1 kg)   SpO2 100%   BMI 23.71 kg/m   Visual Acuity Right Eye Distance:   Left Eye Distance:   Bilateral Distance:    Right Eye Near:   Left Eye Near:    Bilateral Near:     Physical Exam Vitals and nursing note reviewed.  Constitutional:      Appearance: Normal appearance.  Eyes:     General: Lids are everted, no foreign bodies appreciated. Vision grossly intact. No allergic shiner, visual field deficit or scleral icterus.    Extraocular Movements: Extraocular movements intact.     Conjunctiva/sclera: Conjunctivae normal.      Comments: Slight area of dry erythematous skin with slight scale extending down to the eyelashes.  Occasional fleck of crusting on the eyelashes.  Nontender to touch.  Sclera white without injection.  Extraocular movements intact without pain or vision change.  Visual fields are intact.  No photophobia.  Neurological:     Mental Status: She is alert.      UC Treatments / Results  Labs (all labs ordered are listed, but only abnormal results are displayed) Labs Reviewed - No data to display  EKG   Radiology No results found.  Procedures Procedures (including critical care time)  Medications Ordered in UC Medications - No data to display  Initial Impression / Assessment and Plan / UC Course  I have reviewed the triage vital signs and the nursing notes.  Pertinent labs & imaging results that were available during my care of the patient were reviewed by me and considered in my medical decision making (see chart for details).     #Blepharitis She is a 54 year old presenting with possible upper eyelid  blepharitis.  Will use  topical erythromycin ointment, recommend continued warm compress and cool compresses.  Structured follow-up with her primary care next week for further evaluation to ensure resolution.  Return and follow-up precautions discussed.  Patient verbalized understanding. Final Clinical Impressions(s) / UC Diagnoses   Final diagnoses:  Blepharitis of right upper eyelid, unspecified type     Discharge Instructions     Try the ointment on the upper eyelid  Continue warm and cool compresses  Follow up with your primary care to ensure improvement  If worsening prior, return for re-evaluation     ED Prescriptions    Medication Sig Dispense Auth. Provider   erythromycin ophthalmic ointment Place a 1/2 inch ribbon of ointment into the lower eyelid. 3.5 g Vinia Jemmott, Veryl Speak, PA-C     PDMP not reviewed this encounter.   Hermelinda Medicus, PA-C 10/12/19 1925

## 2019-10-12 NOTE — ED Triage Notes (Signed)
Pt states she woke up 2 days ago with her right eye lid swollen and red. Pt's upper outside of eye lid is erythematous. Pt denies vision changes.

## 2019-10-13 ENCOUNTER — Telehealth (HOSPITAL_COMMUNITY): Payer: Self-pay | Admitting: Physician Assistant

## 2019-10-13 MED ORDER — ERYTHROMYCIN 5 MG/GM OP OINT
TOPICAL_OINTMENT | OPHTHALMIC | 0 refills | Status: DC
Start: 2019-10-13 — End: 2020-07-16

## 2019-10-13 NOTE — Telephone Encounter (Cosign Needed)
Pharmacy requested specific directions for ointment, resent with specific instructions on sig

## 2019-12-02 DIAGNOSIS — B009 Herpesviral infection, unspecified: Secondary | ICD-10-CM

## 2019-12-02 MED ORDER — ACYCLOVIR 200 MG PO CAPS: ORAL_CAPSULE | ORAL | 1 refills | Status: DC

## 2020-03-13 DIAGNOSIS — Z79890 Hormone replacement therapy (postmenopausal): Secondary | ICD-10-CM

## 2020-03-13 MED ORDER — ESTRADIOL 0.5 MG PO TABS: 0.5000 mg | ORAL_TABLET | Freq: Every day | ORAL | 1 refills | Status: DC

## 2020-03-13 MED ORDER — PROGESTERONE MICRONIZED 100 MG PO CAPS: 100.0000 mg | ORAL_CAPSULE | Freq: Every day | ORAL | 1 refills | Status: DC

## 2020-03-13 NOTE — Telephone Encounter (Signed)
See the in-network question.

## 2020-05-19 DIAGNOSIS — L821 Other seborrheic keratosis: Secondary | ICD-10-CM | POA: Diagnosis not present

## 2020-05-19 DIAGNOSIS — D1801 Hemangioma of skin and subcutaneous tissue: Secondary | ICD-10-CM | POA: Diagnosis not present

## 2020-05-19 DIAGNOSIS — L700 Acne vulgaris: Secondary | ICD-10-CM | POA: Diagnosis not present

## 2020-05-19 DIAGNOSIS — L218 Other seborrheic dermatitis: Secondary | ICD-10-CM | POA: Diagnosis not present

## 2020-05-19 DIAGNOSIS — L570 Actinic keratosis: Secondary | ICD-10-CM | POA: Diagnosis not present

## 2020-07-02 ENCOUNTER — Other Ambulatory Visit: Payer: Self-pay

## 2020-07-02 ENCOUNTER — Ambulatory Visit
Admission: RE | Admit: 2020-07-02 | Discharge: 2020-07-02 | Disposition: A | Discharge status: Home / Self Care | Payer: BC Managed Care – PPO | Source: Ambulatory Visit | Attending: *Deleted | Admitting: *Deleted

## 2020-07-02 ENCOUNTER — Ambulatory Visit: Payer: 59

## 2020-07-02 DIAGNOSIS — Z1231 Encounter for screening mammogram for malignant neoplasm of breast: Secondary | ICD-10-CM

## 2020-07-03 ENCOUNTER — Other Ambulatory Visit: Payer: Self-pay | Admitting: Nurse Practitioner

## 2020-07-03 DIAGNOSIS — Z1231 Encounter for screening mammogram for malignant neoplasm of breast: Secondary | ICD-10-CM

## 2020-07-09 ENCOUNTER — Ambulatory Visit: Payer: 59 | Admitting: Nurse Practitioner
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
## 2020-07-16 ENCOUNTER — Ambulatory Visit (INDEPENDENT_AMBULATORY_CARE_PROVIDER_SITE_OTHER): Payer: BC Managed Care – PPO | Admitting: Nurse Practitioner

## 2020-07-16 ENCOUNTER — Encounter: Payer: Self-pay | Admitting: Nurse Practitioner

## 2020-07-16 ENCOUNTER — Other Ambulatory Visit: Payer: Self-pay

## 2020-07-16 VITALS — BP 110/68 | HR 69 | Resp 16 | Ht 71.0 in | Wt 169.0 lb

## 2020-07-16 DIAGNOSIS — Z130 Encounter for screening for diseases of the blood and blood-forming organs and certain disorders involving the immune mechanism: Secondary | ICD-10-CM

## 2020-07-16 DIAGNOSIS — Z13228 Encounter for screening for other metabolic disorders: Secondary | ICD-10-CM | POA: Diagnosis not present

## 2020-07-16 DIAGNOSIS — Z01419 Encounter for gynecological examination (general) (routine) without abnormal findings: Secondary | ICD-10-CM

## 2020-07-16 DIAGNOSIS — Z13220 Encounter for screening for lipoid disorders: Secondary | ICD-10-CM | POA: Diagnosis not present

## 2020-07-16 DIAGNOSIS — Z1159 Encounter for screening for other viral diseases: Secondary | ICD-10-CM | POA: Diagnosis not present

## 2020-07-16 DIAGNOSIS — Z79890 Hormone replacement therapy (postmenopausal): Secondary | ICD-10-CM

## 2020-07-16 MED ORDER — ESTRADIOL 0.5 MG PO TABS: 0.5000 mg | ORAL_TABLET | Freq: Every day | ORAL | 4 refills | Status: DC

## 2020-07-16 MED ORDER — PROGESTERONE MICRONIZED 100 MG PO CAPS: 100.0000 mg | ORAL_CAPSULE | Freq: Every day | ORAL | 4 refills | Status: DC

## 2020-07-16 NOTE — Patient Instructions (Addendum)
Restless Legs Syndrome Restless legs syndrome is a condition that causes uncomfortable feelings or sensations in the legs, especially while sitting or lying down. The sensations usually cause an overwhelming urge to move the legs. The arms can also sometimes be affected. The condition can range from mild to severe. The symptoms often interfere with a person's ability to sleep. What are the causes? The cause of this condition is not known. What increases the risk? The following factors may make you more likely to develop this condition:  Being older than 50.  Pregnancy.  Being a woman. In general, the condition is more common in women than in men.  A family history of the condition.  Having iron deficiency.  Overuse of caffeine, nicotine, or alcohol.  Certain medical conditions, such as kidney disease, Parkinson's disease, or nerve damage.  Certain medicines, such as those for high blood pressure, nausea, colds, allergies, depression, and some heart conditions. What are the signs or symptoms? The main symptom of this condition is uncomfortable sensations in the legs, such as:  Pulling.  Tingling.  Prickling.  Throbbing.  Crawling.  Burning. Usually, the sensations:  Affect both sides of the body.  Are worse when you sit or lie down.  Are worse at night. These may wake you up or make it difficult to fall asleep.  Make you have a strong urge to move your legs.  Are temporarily relieved by moving your legs. The arms can also be affected, but this is rare. People who have this condition often have tiredness during the day because of their lack of sleep at night. How is this diagnosed? This condition may be diagnosed based on:  Your symptoms.  Blood tests. In some cases, you may be monitored in a sleep lab by a specialist (a sleep study). This can detect any disruptions in your sleep. How is this treated? This condition is treated by managing the symptoms. This may  include:  Lifestyle changes, such as exercising, using relaxation techniques, and avoiding caffeine, alcohol, or tobacco.  Medicines. Anti-seizure medicines may be tried first. Follow these instructions at home: General instructions  Take over-the-counter and prescription medicines only as told by your health care provider.  Use methods to help relieve the uncomfortable sensations, such as: ? Massaging your legs. ? Walking or stretching. ? Taking a cold or hot bath.  Keep all follow-up visits as told by your health care provider. This is important. Lifestyle  Practice good sleep habits. For example, go to bed and get up at the same time every day. Most adults should get 7-9 hours of sleep each night.  Exercise regularly. Try to get at least 30 minutes of exercise most days of the week.  Practice ways of relaxing, such as yoga or meditation.  Avoid caffeine and alcohol.  Do not use any products that contain nicotine or tobacco, such as cigarettes and e-cigarettes. If you need help quitting, ask your health care provider.      Contact a health care provider if:  Your symptoms get worse or they do not improve with treatment. Summary  Restless legs syndrome is a condition that causes uncomfortable feelings or sensations in the legs, especially while sitting or lying down.  The symptoms often interfere with a person's ability to sleep.  This condition is treated by managing the symptoms. You may need to make lifestyle changes or take medicines. This information is not intended to replace advice given to you by your health care provider. Make   sure you discuss any questions you have with your health care provider. Document Revised: 04/05/2019 Document Reviewed: 03/06/2017 Elsevier Patient Education  2021 Elsevier Inc.   Health Maintenance for Postmenopausal Women Menopause is a normal process in which your ability to get pregnant comes to an end. This process happens slowly  over many months or years, usually between the ages of 43 and 54. Menopause is complete when you have missed your menstrual periods for 12 months. It is important to talk with your health care provider about some of the most common conditions that affect women after menopause (postmenopausal women). These include heart disease, cancer, and bone loss (osteoporosis). Adopting a healthy lifestyle and getting preventive care can help to promote your health and wellness. The actions you take can also lower your chances of developing some of these common conditions. What should I know about menopause? During menopause, you may get a number of symptoms, such as:  Hot flashes. These can be moderate or severe.  Night sweats.  Decrease in sex drive.  Mood swings.  Headaches.  Tiredness.  Irritability.  Memory problems.  Insomnia. Choosing to treat or not to treat these symptoms is a decision that you make with your health care provider. Do I need hormone replacement therapy?  Hormone replacement therapy is effective in treating symptoms that are caused by menopause, such as hot flashes and night sweats.  Hormone replacement carries certain risks, especially as you become older. If you are thinking about using estrogen or estrogen with progestin, discuss the benefits and risks with your health care provider. What is my risk for heart disease and stroke? The risk of heart disease, heart attack, and stroke increases as you age. One of the causes may be a change in the body's hormones during menopause. This can affect how your body uses dietary fats, triglycerides, and cholesterol. Heart attack and stroke are medical emergencies. There are many things that you can do to help prevent heart disease and stroke. Watch your blood pressure  High blood pressure causes heart disease and increases the risk of stroke. This is more likely to develop in people who have high blood pressure readings, are of  African descent, or are overweight.  Have your blood pressure checked: ? Every 3-5 years if you are 16-36 years of age. ? Every year if you are 60 years old or older. Eat a healthy diet  Eat a diet that includes plenty of vegetables, fruits, low-fat dairy products, and lean protein.  Do not eat a lot of foods that are high in solid fats, added sugars, or sodium.   Get regular exercise Get regular exercise. This is one of the most important things you can do for your health. Most adults should:  Try to exercise for at least 150 minutes each week. The exercise should increase your heart rate and make you sweat (moderate-intensity exercise).  Try to do strengthening exercises at least twice each week. Do these in addition to the moderate-intensity exercise.  Spend less time sitting. Even light physical activity can be beneficial. Other tips  Work with your health care provider to achieve or maintain a healthy weight.  Do not use any products that contain nicotine or tobacco, such as cigarettes, e-cigarettes, and chewing tobacco. If you need help quitting, ask your health care provider.  Know your numbers. Ask your health care provider to check your cholesterol and your blood sugar (glucose). Continue to have your blood tested as directed by your health care  provider. Do I need screening for cancer? Depending on your health history and family history, you may need to have cancer screening at different stages of your life. This may include screening for:  Breast cancer.  Cervical cancer.  Lung cancer.  Colorectal cancer. What is my risk for osteoporosis? After menopause, you may be at increased risk for osteoporosis. Osteoporosis is a condition in which bone destruction happens more quickly than new bone creation. To help prevent osteoporosis or the bone fractures that can happen because of osteoporosis, you may take the following actions:  If you are 60-62 years old, get at least  1,000 mg of calcium and at least 600 mg of vitamin D per day.  If you are older than age 31 but younger than age 26, get at least 1,200 mg of calcium and at least 600 mg of vitamin D per day.  If you are older than age 10, get at least 1,200 mg of calcium and at least 800 mg of vitamin D per day. Smoking and drinking excessive alcohol increase the risk of osteoporosis. Eat foods that are rich in calcium and vitamin D, and do weight-bearing exercises several times each week as directed by your health care provider. How does menopause affect my mental health? Depression may occur at any age, but it is more common as you become older. Common symptoms of depression include:  Low or sad mood.  Changes in sleep patterns.  Changes in appetite or eating patterns.  Feeling an overall lack of motivation or enjoyment of activities that you previously enjoyed.  Frequent crying spells. Talk with your health care provider if you think that you are experiencing depression. General instructions See your health care provider for regular wellness exams and vaccines. This may include:  Scheduling regular health, dental, and eye exams.  Getting and maintaining your vaccines. These include: ? Influenza vaccine. Get this vaccine each year before the flu season begins. ? Pneumonia vaccine. ? Shingles vaccine. ? Tetanus, diphtheria, and pertussis (Tdap) booster vaccine. Your health care provider may also recommend other immunizations. Tell your health care provider if you have ever been abused or do not feel safe at home. Summary  Menopause is a normal process in which your ability to get pregnant comes to an end.  This condition causes hot flashes, night sweats, decreased interest in sex, mood swings, headaches, or lack of sleep.  Treatment for this condition may include hormone replacement therapy.  Take actions to keep yourself healthy, including exercising regularly, eating a healthy diet,  watching your weight, and checking your blood pressure and blood sugar levels.  Get screened for cancer and depression. Make sure that you are up to date with all your vaccines. This information is not intended to replace advice given to you by your health care provider. Make sure you discuss any questions you have with your health care provider. Document Revised: 02/07/2018 Document Reviewed: 02/07/2018 Elsevier Patient Education  2021 [employer].

## 2020-07-17 LAB — COMPREHENSIVE METABOLIC PANEL
AG Ratio: 1.5 (calc) (ref 1.0–2.5)
ALT: 10 U/L (ref 6–29)
AST: 15 U/L (ref 10–35)
Albumin: 4.3 g/dL (ref 3.6–5.1)
Alkaline phosphatase (APISO): 67 U/L (ref 37–153)
BUN: 16 mg/dL (ref 7–25)
CO2: 23 mmol/L (ref 20–32)
Calcium: 9.5 mg/dL (ref 8.6–10.4)
Chloride: 102 mmol/L (ref 98–110)
Creat: 1 mg/dL (ref 0.50–1.05)
Globulin: 2.8 g/dL (calc) (ref 1.9–3.7)
Glucose, Bld: 88 mg/dL (ref 65–99)
Potassium: 4 mmol/L (ref 3.5–5.3)
Sodium: 136 mmol/L (ref 135–146)
Total Bilirubin: 0.5 mg/dL (ref 0.2–1.2)
Total Protein: 7.1 g/dL (ref 6.1–8.1)

## 2020-07-17 LAB — CBC
HCT: 39.6 % (ref 35.0–45.0)
Hemoglobin: 13 g/dL (ref 11.7–15.5)
MCH: 31.3 pg (ref 27.0–33.0)
MCHC: 32.8 g/dL (ref 32.0–36.0)
MCV: 95.4 fL (ref 80.0–100.0)
MPV: 11.2 fL (ref 7.5–12.5)
Platelets: 255 10*3/uL (ref 140–400)
RBC: 4.15 10*6/uL (ref 3.80–5.10)
RDW: 12.5 % (ref 11.0–15.0)
WBC: 6.1 10*3/uL (ref 3.8–10.8)

## 2020-07-17 LAB — HEPATITIS C ANTIBODY
Hepatitis C Ab: NONREACTIVE
SIGNAL TO CUT-OFF: 0.05 (ref <1.00)

## 2020-07-17 LAB — LIPID PANEL
Cholesterol: 221 mg/dL — ABNORMAL HIGH (ref <200)
HDL: 55 mg/dL (ref >=50)
LDL Cholesterol (Calc): 149 mg/dL (calc) — ABNORMAL HIGH
Non-HDL Cholesterol (Calc): 166 mg/dL (calc) — ABNORMAL HIGH (ref <130)
Total CHOL/HDL Ratio: 4 (calc) (ref <5.0)
Triglycerides: 71 mg/dL (ref <150)

## 2020-08-03 ENCOUNTER — Ambulatory Visit: Payer: BC Managed Care – PPO | Admitting: Nurse Practitioner

## 2020-08-03 NOTE — Progress Notes (Signed)
GYNECOLOGY  VISIT  CC:   Finish annual exam/unable to complete pelvic portion of exam from 07/16/2020 visit  HPI: 55 y.o. G0P0000 Single White or Caucasian female here for pelvic exam.   Denies problems today, denies any bleeding  GYNECOLOGIC HISTORY: Patient's last menstrual period was 07/18/2018. Contraception: post menopausal Menopausal hormone therapy: estrace tablet & prometrium  Patient Active Problem List   Diagnosis Date Noted  . Vitamin D deficiency 07/09/2019  . History of bilateral tubal ligation 06/19/2015  . 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 Acne     Past Surgical History:  Procedure Laterality Date  . TUBAL LIGATION  1995    MEDS:   Current Outpatient Medications on File Prior to Visit  Medication Sig Dispense Refill  . acyclovir (ZOVIRAX) 200 MG capsule Take one daily to prevent. If feel symptoms take 2 daily until subsides. 120 capsule 1  . calcium carbonate (OS-CAL - DOSED IN MG OF ELEMENTAL CALCIUM) 1250 MG tablet Take 1 tablet by mouth daily.    . Calcium-Magnesium (CAL-MAG PO) Take 2 capsules by mouth daily.    . Cholecalciferol (VITAMIN D3 PO) Take 1 tablet by mouth daily.    Marland Kitchen estradiol (ESTRACE) 0.5 MG tablet Take 1 tablet (0.5 mg total) by mouth daily. 90 tablet 4  . ferrous sulfate 325 (65 FE) MG tablet Take 325 mg by mouth daily with breakfast.    . ibuprofen (ADVIL) 600 MG tablet TAKE 1 TABLET BY MOUTH  EVERY 8 HOURS AS NEEDED 180 tablet 0  . metroNIDAZOLE (METROCREAM) 0.75 % cream Apply topically 2 (two) times daily.    . multivitamin (THERAGRAN) per tablet Take 1 tablet by mouth daily.    . progesterone (PROMETRIUM) 100 MG capsule Take 1 capsule (100 mg total) by mouth at bedtime. 90 capsule 4  . spironolactone (ALDACTONE) 25 MG tablet Take 25 mg by mouth daily.    . Sulfacetamide Sodium-Sulfur 10-5 % CREA Apply topically.     No current facility-administered medications on file prior to visit.     ALLERGIES: Patient has no known allergies.  Family History  Problem Relation Age of Onset  . Diabetes Mother   . Alzheimer's disease Father   . Diabetes Maternal Grandmother   . Heart disease Paternal Grandfather   . Heart attack Paternal Grandfather      Review of Systems  Constitutional: Negative.   HENT: Negative.   Eyes: Negative.   Respiratory: Negative.   Cardiovascular: Negative.   Gastrointestinal: Negative.   Endocrine: Negative.   Genitourinary: Negative.   Musculoskeletal: Negative.   Skin: Negative.   Allergic/Immunologic: Negative.   Neurological: Negative.   Hematological: Negative.   Psychiatric/Behavioral: Negative.     PHYSICAL EXAMINATION:    BP 110/70   Pulse 68   Resp 16   Wt 169 lb (76.7 kg)   LMP 07/18/2018   BMI 23.57 kg/m     General appearance: alert, cooperative, no acute distress   Pelvic: External genitalia:  no lesions              Urethra:  normal appearing urethra with no masses, tenderness or lesions              Bartholins and Skenes: normal                 Vagina: normal appearing vagina               Cervix: no cervical motion  tenderness and no lesions              Bimanual Exam:  Uterus:  normal size, contour, position, consistency, mobility, non-tender              Adnexa: no mass, fullness, tenderness             Rectal: no palpable mass, some mild erythema in anal area   Assessment: Pelvic exam WNL- Annual exam completed from 07/16/2020  Plan:  Discussed use of Vaseline externally for external irritation Discussed colonoscopy vs cologuard. At this time pt can not schedule colonoscopy because she does not have a ride to and from appointment. She will try to arrange when she visits family in [place] in September. If unable to do this, will call to arrange for cologuard.   Pap : Due 2024             Mammogram: UTD, due 06/2021             Labs: CBC, CMP, Cholesterol panel, Hep C done 07/16/2020.  (Cholesterol  elevation-discussed)             Medications: will refill Estrace 0.5mg  and Prometrium 100mg

## 2020-08-04 ENCOUNTER — Other Ambulatory Visit: Payer: Self-pay

## 2020-08-04 ENCOUNTER — Ambulatory Visit (INDEPENDENT_AMBULATORY_CARE_PROVIDER_SITE_OTHER): Payer: BC Managed Care – PPO | Admitting: Nurse Practitioner

## 2020-08-04 ENCOUNTER — Encounter: Payer: Self-pay | Admitting: Nurse Practitioner

## 2020-08-04 VITALS — BP 110/70 | HR 68 | Resp 16 | Wt 169.0 lb

## 2020-08-04 DIAGNOSIS — Z01419 Encounter for gynecological examination (general) (routine) without abnormal findings: Secondary | ICD-10-CM

## 2020-08-21 DIAGNOSIS — B079 Viral wart, unspecified: Secondary | ICD-10-CM | POA: Diagnosis not present

## 2020-08-21 DIAGNOSIS — R040 Epistaxis: Secondary | ICD-10-CM | POA: Diagnosis not present

## 2020-11-10 ENCOUNTER — Ambulatory Visit (INDEPENDENT_AMBULATORY_CARE_PROVIDER_SITE_OTHER): Payer: BC Managed Care – PPO | Admitting: Otolaryngology

## 2021-02-23 ENCOUNTER — Ambulatory Visit (INDEPENDENT_AMBULATORY_CARE_PROVIDER_SITE_OTHER): Payer: BC Managed Care – PPO

## 2021-02-23 ENCOUNTER — Ambulatory Visit (INDEPENDENT_AMBULATORY_CARE_PROVIDER_SITE_OTHER): Payer: BC Managed Care – PPO | Admitting: Podiatry

## 2021-02-23 ENCOUNTER — Encounter: Payer: Self-pay | Admitting: Podiatry

## 2021-02-23 ENCOUNTER — Other Ambulatory Visit: Payer: Self-pay

## 2021-02-23 DIAGNOSIS — M7742 Metatarsalgia, left foot: Secondary | ICD-10-CM

## 2021-02-23 DIAGNOSIS — M7741 Metatarsalgia, right foot: Secondary | ICD-10-CM | POA: Diagnosis not present

## 2021-02-23 DIAGNOSIS — M79671 Pain in right foot: Secondary | ICD-10-CM

## 2021-02-23 DIAGNOSIS — M79672 Pain in left foot: Secondary | ICD-10-CM | POA: Diagnosis not present

## 2021-02-23 DIAGNOSIS — L84 Corns and callosities: Secondary | ICD-10-CM | POA: Diagnosis not present
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
## 2021-05-25 DIAGNOSIS — D487 Neoplasm of uncertain behavior of other specified sites: Secondary | ICD-10-CM | POA: Diagnosis not present

## 2021-05-25 DIAGNOSIS — M71372 Other bursal cyst, left ankle and foot: Secondary | ICD-10-CM | POA: Diagnosis not present

## 2021-06-01 ENCOUNTER — Other Ambulatory Visit: Payer: Self-pay | Admitting: Obstetrics and Gynecology

## 2021-06-01 DIAGNOSIS — Z1231 Encounter for screening mammogram for malignant neoplasm of breast: Secondary | ICD-10-CM

## 2021-06-21 ENCOUNTER — Other Ambulatory Visit: Payer: Self-pay | Admitting: Nurse Practitioner

## 2021-06-21 DIAGNOSIS — B009 Herpesviral infection, unspecified: Secondary | ICD-10-CM

## 2021-06-22 NOTE — Telephone Encounter (Signed)
AEX 07/16/20. ?

## 2021-07-06 ENCOUNTER — Ambulatory Visit
Admission: RE | Admit: 2021-07-06 | Discharge: 2021-07-06 | Disposition: A | Discharge status: Home / Self Care | Payer: BC Managed Care – PPO | Source: Ambulatory Visit

## 2021-07-06 DIAGNOSIS — Z1231 Encounter for screening mammogram for malignant neoplasm of breast: Secondary | ICD-10-CM | POA: Diagnosis not present

## 2021-07-21 DIAGNOSIS — Z01419 Encounter for gynecological examination (general) (routine) without abnormal findings: Secondary | ICD-10-CM | POA: Diagnosis not present

## 2021-07-28 ENCOUNTER — Ambulatory Visit: Payer: BC Managed Care – PPO | Admitting: Nurse Practitioner

## 2022-03-02 ENCOUNTER — Ambulatory Visit: Payer: BC Managed Care – PPO | Admitting: Obstetrics & Gynecology

## 2022-03-17 DIAGNOSIS — Z1231 Encounter for screening mammogram for malignant neoplasm of breast: Secondary | ICD-10-CM

## 2022-03-21 ENCOUNTER — Other Ambulatory Visit: Payer: Self-pay | Admitting: Obstetrics & Gynecology

## 2022-03-21 DIAGNOSIS — Z1231 Encounter for screening mammogram for malignant neoplasm of breast: Secondary | ICD-10-CM

## 2022-05-19 ENCOUNTER — Ambulatory Visit (INDEPENDENT_AMBULATORY_CARE_PROVIDER_SITE_OTHER): Payer: BC Managed Care – PPO | Admitting: Obstetrics & Gynecology

## 2022-05-19 ENCOUNTER — Encounter: Payer: Self-pay | Admitting: Obstetrics & Gynecology

## 2022-05-19 VITALS — BP 120/78 | HR 67 | Ht 70.75 in | Wt 174.0 lb

## 2022-05-19 DIAGNOSIS — Z780 Asymptomatic menopausal state: Secondary | ICD-10-CM | POA: Diagnosis not present

## 2022-05-19 DIAGNOSIS — Z01419 Encounter for gynecological examination (general) (routine) without abnormal findings: Secondary | ICD-10-CM | POA: Diagnosis not present

## 2022-05-19 NOTE — Progress Notes (Signed)
Diana Vazquez [birth year] [id number]   History:    57 y.o. G0  RP:  Established patient presenting for annual gyn exam   HPI: Postmenopause x 4 years, well on no HRT.  No PMB.  No pelvic pain.  Not currently sexually active.  Last Pap Neg in 2023, will obtain report.  No h/o abnormal Pap.  Repeat Pap in 3 years.  Breasts normal.  Mammo Neg 06/2021.  Will schedule Col[facility] with Gastro. BMI 24.44.  Walks her cat. Health labs with Fam MD.    Past medical history,surgical history, family history and social history were all reviewed and documented in the EPIC chart.  Gynecologic History Patient's last menstrual period was 07/18/2018.  Obstetric History OB History  Gravida Para Term Preterm AB Living  0 0 0 0 0 0  SAB IAB Ectopic Multiple Live Births  0 0 0 0 0     ROS: A ROS was performed and pertinent positives and negatives are included in the history. GENERAL: No fevers or chills. HEENT: No change in vision, no earache, sore throat or sinus congestion. NECK: No pain or stiffness. CARDIOVASCULAR: No chest pain or pressure. No palpitations. PULMONARY: No shortness of breath, cough or wheeze. GASTROINTESTINAL: No abdominal pain, nausea, vomiting or diarrhea, melena or bright red blood per rectum. GENITOURINARY: No urinary frequency, urgency, hesitancy or dysuria. MUSCULOSKELETAL: No joint or muscle pain, no back pain, no recent trauma. DERMATOLOGIC: No rash, no itching, no lesions. ENDOCRINE: No polyuria, polydipsia, no heat or cold intolerance. No recent change in weight. HEMATOLOGICAL: No anemia or easy bruising or bleeding. NEUROLOGIC: No headache, seizures, numbness, tingling or weakness. PSYCHIATRIC: No depression, no loss of interest in normal activity or change in sleep pattern.     Exam:   BP 120/78   Pulse 67   Ht 5' 10.75" (1.797 m)   Wt 174 lb (78.9 kg)   LMP 07/18/2018 Comment: btl, not sexually active  SpO2 97%   BMI 24.44 kg/m   Body mass index is 24.44  kg/m.  General appearance : Well developed well nourished female. No acute distress HEENT: Eyes: no retinal hemorrhage or exudates,  Neck supple, trachea midline, no carotid bruits, no thyroidmegaly Lungs: Clear to auscultation, no rhonchi or wheezes, or rib retractions  Heart: Regular rate and rhythm, no murmurs or gallops Breast:Examined in sitting and supine position were symmetrical in appearance, no palpable masses or tenderness,  no skin retraction, no nipple inversion, no nipple discharge, no skin discoloration, no axillary or supraclavicular lymphadenopathy Abdomen: no palpable masses or tenderness, no rebound or guarding Extremities: no edema or skin discoloration or tenderness  Pelvic: Vulva: Normal             Vagina: No gross lesions or discharge  Cervix: No gross lesions or discharge  Uterus  AV, normal size, shape and consistency, non-tender and mobile  Adnexa  Without masses or tenderness  Anus: Normal   Assessment/Plan:  57 y.o. female for annual exam   1. Well female exam with routine gynecological exam Postmenopause x 4 years, well on no HRT.  No PMB.  No pelvic pain.  Not currently sexually active.  Last Pap Neg in 2023, will obtain report.  No h/o abnormal Pap.  Repeat Pap in 3 years.  Breasts normal.  Mammo Neg 06/2021.  Will schedule Col[facility] with Gastro. BMI 24.44.  Walks her cat. Health labs with Fam MD.    2. Postmenopause Postmenopause x 4 years, well on no HRT.  No PMB.  No pelvic pain.  Not currently sexually active.  Other orders - MAGNESIUM-ZINC PO; Take by mouth.   Princess Bruins MD, 2:09 PM

## 2022-05-26 DIAGNOSIS — L218 Other seborrheic dermatitis: Secondary | ICD-10-CM | POA: Diagnosis not present

## 2022-07-03 ENCOUNTER — Encounter: Payer: Self-pay | Admitting: Obstetrics & Gynecology

## 2022-07-05 NOTE — Telephone Encounter (Signed)
Pt LVM in triage line stating that she would like to be placed on back on HRT.  Last AEX 05/19/2022--recall placed for 2025. Last mammo 07/06/2021--scheduled for 07/08/2022.  Spoke w/ pt and she reports troubles with sleep and feels as if she cannot be at the right temperature. Pt also reports troubles with appetite (constantly hungry). States grumpiness is troublesome but feels as if due to lack of sleep.  Denies any changes in health hx since being seen and denies hx of any heart or BP issues.   Was placed on HRTs in early 50s but stopped due to a holistic provider informing her of risks of brain damage. Pt advised that there are risks of blood clots, stroke, heart problems, and breast cancer. Pt voiced understanding. Pt would still prefer to return. Was on estrace and progesterone previously.   Please advise.

## 2022-07-08 ENCOUNTER — Ambulatory Visit
Admission: RE | Admit: 2022-07-08 | Discharge: 2022-07-08 | Disposition: A | Discharge status: Home / Self Care | Payer: BC Managed Care – PPO | Source: Ambulatory Visit

## 2022-07-08 DIAGNOSIS — Z1231 Encounter for screening mammogram for malignant neoplasm of breast: Secondary | ICD-10-CM | POA: Diagnosis not present

## 2022-07-11 NOTE — Telephone Encounter (Signed)
Genia Del, MD  Keenan Bachelor, RMA5 days ago    Can send a prescription of Estradiol patch 0.05 twice a week # 24 with Progesterone 100 mg caps PO HS #90.  Refill x 4. Note, if patient prefers Estradiol tablet, can send 0.5 mg PO daily #90, refill x4 instead of the patch. Dr Elbert Ewings    Routing to [facility] Triage POOL

## 2022-07-27 ENCOUNTER — Ambulatory Visit: Payer: BC Managed Care – PPO | Admitting: Obstetrics & Gynecology

## 2023-05-17 ENCOUNTER — Other Ambulatory Visit: Payer: Self-pay | Admitting: Obstetrics and Gynecology

## 2023-05-17 DIAGNOSIS — Z1231 Encounter for screening mammogram for malignant neoplasm of breast: Secondary | ICD-10-CM

## 2023-05-22 DIAGNOSIS — R200 Anesthesia of skin: Secondary | ICD-10-CM | POA: Diagnosis not present

## 2023-05-25 DIAGNOSIS — D169 Benign neoplasm of bone and articular cartilage, unspecified: Secondary | ICD-10-CM | POA: Diagnosis not present

## 2023-05-25 DIAGNOSIS — L821 Other seborrheic keratosis: Secondary | ICD-10-CM | POA: Diagnosis not present

## 2023-05-25 DIAGNOSIS — L700 Acne vulgaris: Secondary | ICD-10-CM | POA: Diagnosis not present

## 2023-06-06 DIAGNOSIS — Z1339 Encounter for screening examination for other mental health and behavioral disorders: Secondary | ICD-10-CM | POA: Diagnosis not present

## 2023-06-06 DIAGNOSIS — Z01419 Encounter for gynecological examination (general) (routine) without abnormal findings: Secondary | ICD-10-CM | POA: Diagnosis not present

## 2023-07-10 ENCOUNTER — Ambulatory Visit
Admission: RE | Admit: 2023-07-10 | Discharge: 2023-07-10 | Disposition: A | Discharge status: Home / Self Care | Payer: Self-pay | Source: Ambulatory Visit | Attending: Obstetrics and Gynecology | Admitting: Obstetrics and Gynecology

## 2023-07-10 DIAGNOSIS — Z1231 Encounter for screening mammogram for malignant neoplasm of breast: Secondary | ICD-10-CM | POA: Diagnosis not present

## 2023-10-20 IMAGING — MG MM DIGITAL SCREENING BILAT W/ TOMO AND CAD
6 of 10 series · 6 of 30 positions shown · non-contrast
Comparison: Previous exam(s).

CLINICAL DATA: Screening.

EXAM:
DIGITAL SCREENING BILATERAL MAMMOGRAM WITH TOMOSYNTHESIS AND CAD
TECHNIQUE: Bilateral screening digital craniocaudal and mediolateral oblique
mammograms were obtained. Bilateral screening digital breast
tomosynthesis was performed. The images were evaluated with
computer-aided detection.

[L MLO synth-2D]
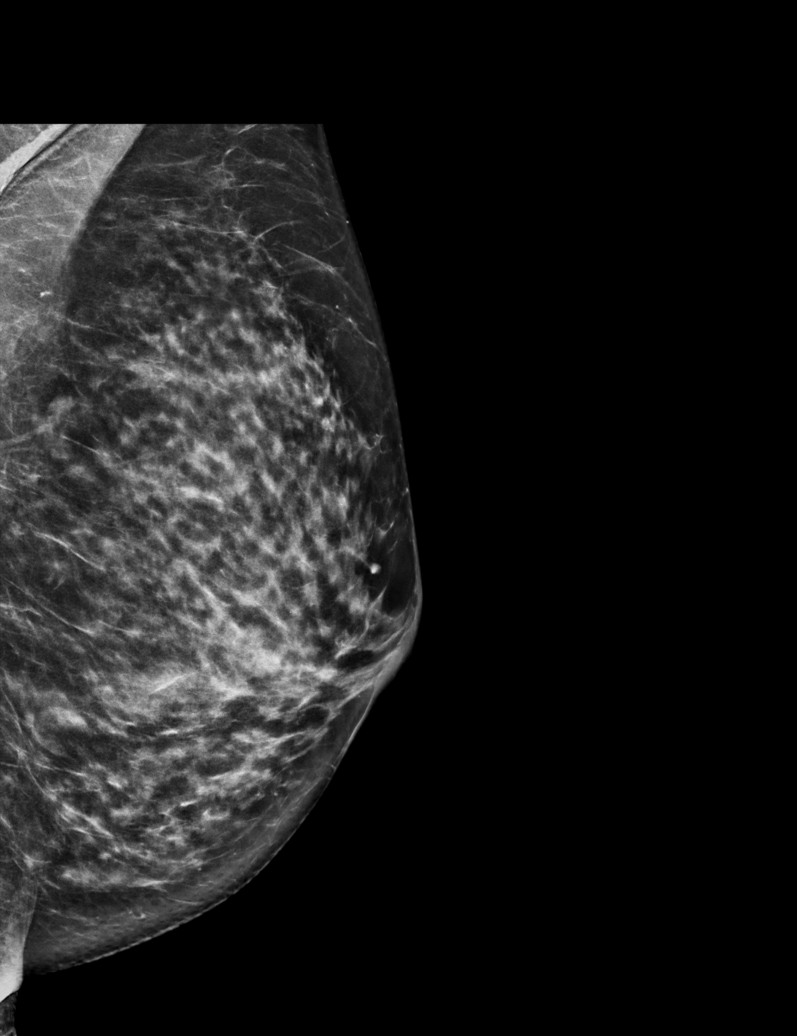

[R MLO synth-2D]
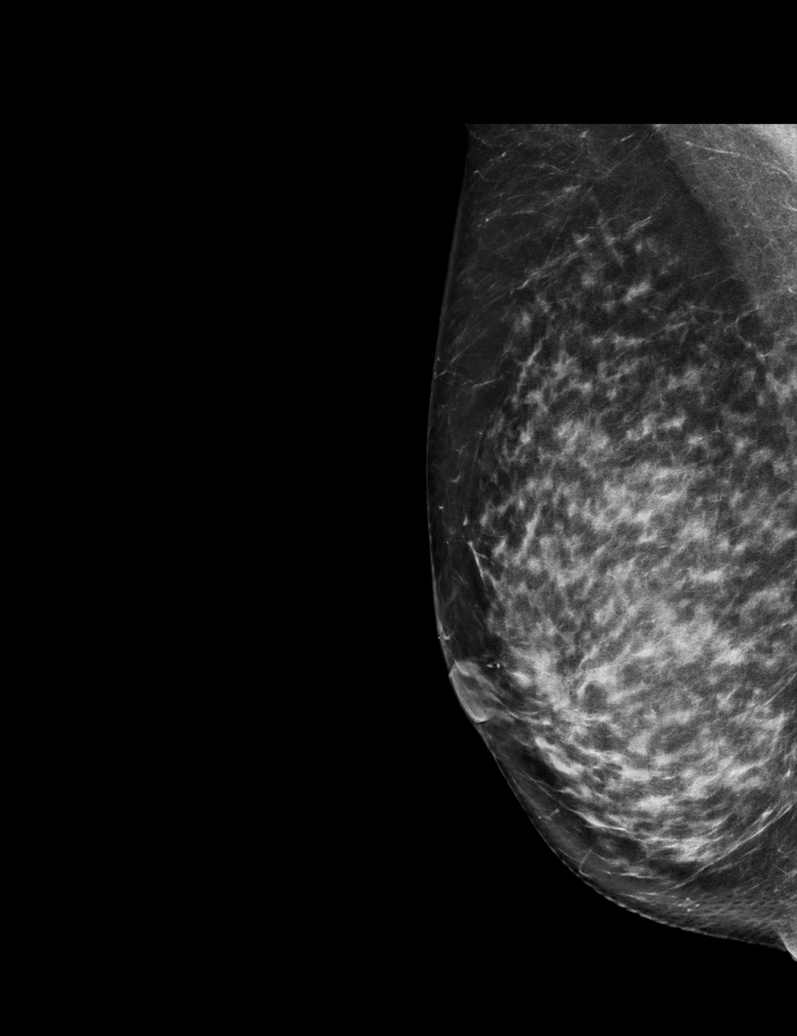

[R CC synth-2D (1 of 2)]
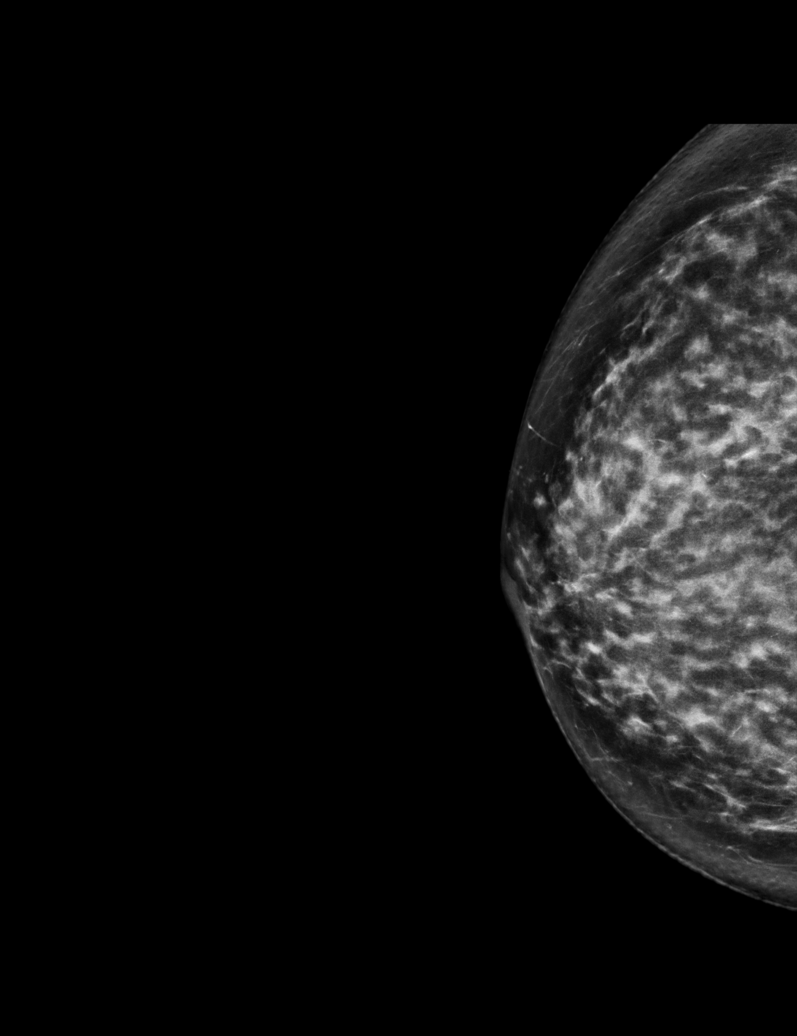

[L CC synth-2D]
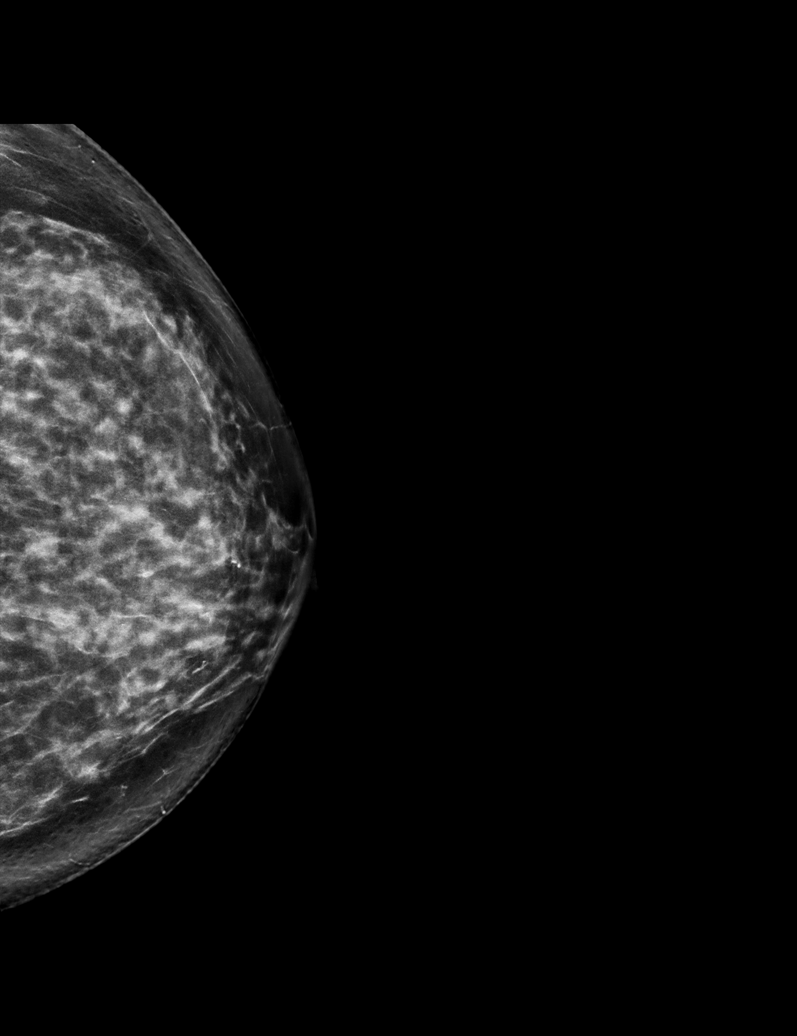

[R CC synth-2D (2 of 2)]
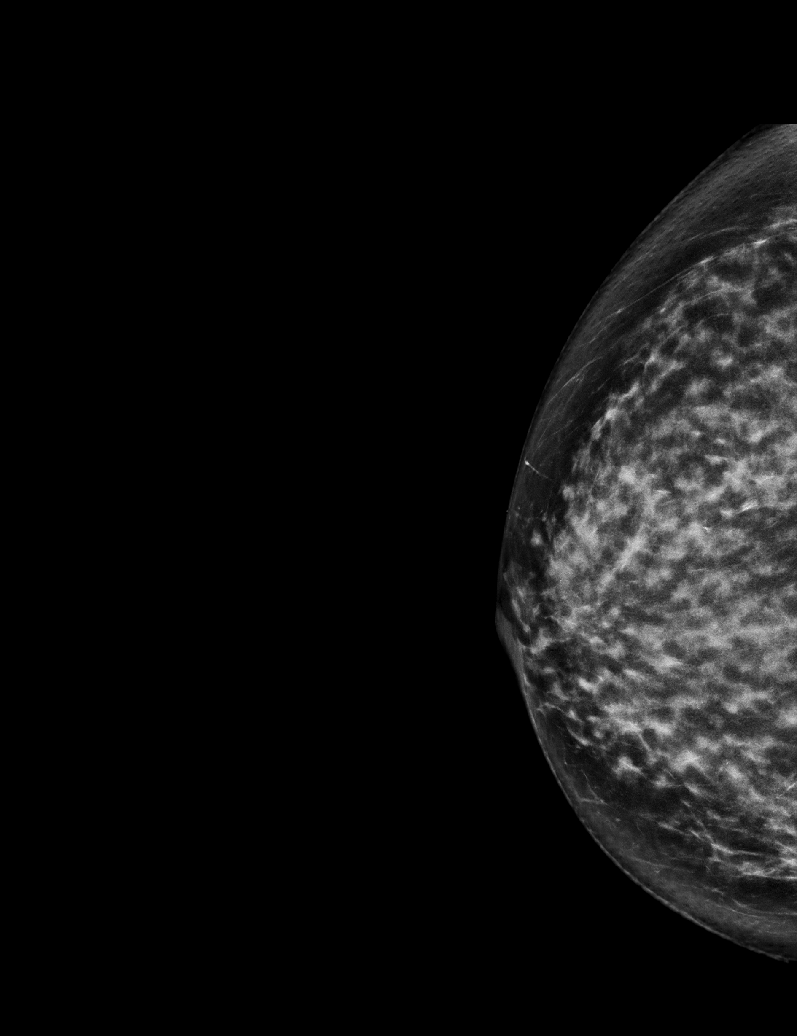

[R CC tomo · tomo slice 35/68.0]
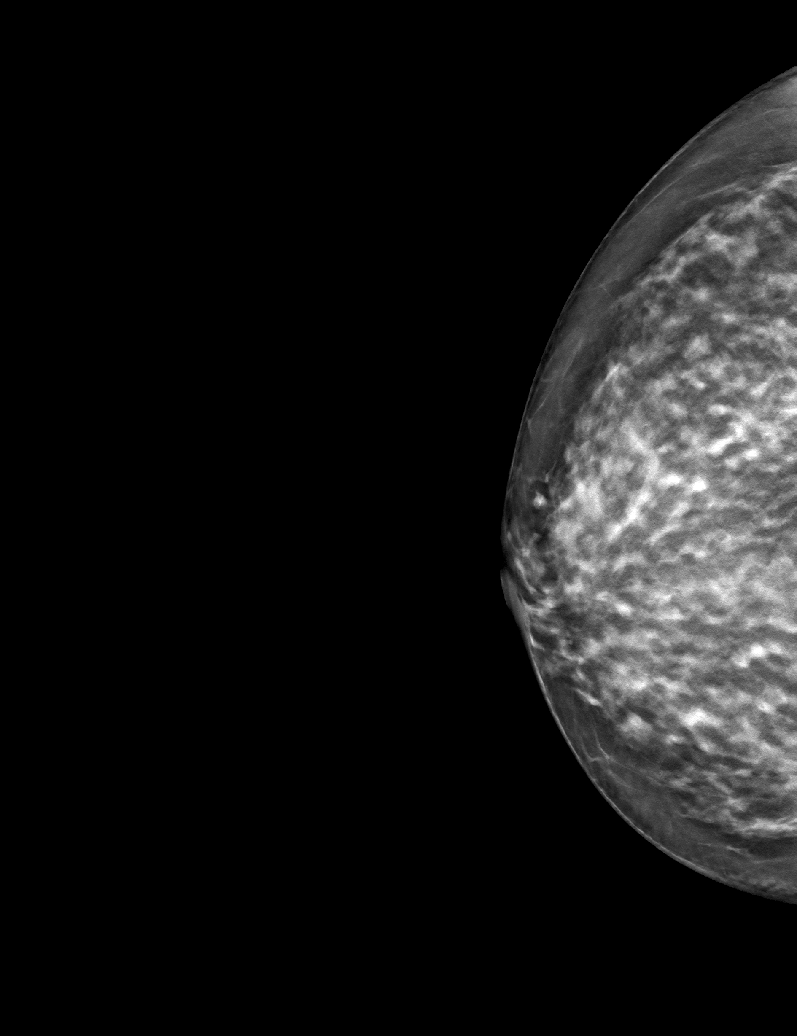

[6 of 30 positions shown; findings below may reference images not displayed]

ACR Breast Density Category d: The breast tissue is extremely dense,
which lowers the sensitivity of mammography
FINDINGS: There are no findings suspicious for malignancy.
IMPRESSION: No mammographic evidence of malignancy. A result letter of this
screening mammogram will be mailed directly to the patient.

RECOMMENDATION:
Screening mammogram in one year. (Code:[id number])

BI-RADS CATEGORY  1: Negative.

## 2023-12-20 DIAGNOSIS — R202 Paresthesia of skin: Secondary | ICD-10-CM | POA: Diagnosis not present

## 2023-12-20 DIAGNOSIS — R42 Dizziness and giddiness: Secondary | ICD-10-CM | POA: Diagnosis not present

## 2023-12-28 DIAGNOSIS — R140 Abdominal distension (gaseous): Secondary | ICD-10-CM | POA: Diagnosis not present

## 2024-01-16 DIAGNOSIS — Z131 Encounter for screening for diabetes mellitus: Secondary | ICD-10-CM | POA: Diagnosis not present

## 2024-01-16 DIAGNOSIS — R5383 Other fatigue: Secondary | ICD-10-CM | POA: Diagnosis not present

## 2024-01-16 DIAGNOSIS — E559 Vitamin D deficiency, unspecified: Secondary | ICD-10-CM | POA: Diagnosis not present

## 2024-01-16 DIAGNOSIS — Z13220 Encounter for screening for lipoid disorders: Secondary | ICD-10-CM | POA: Diagnosis not present

## 2024-01-16 DIAGNOSIS — Z0000 Encounter for general adult medical examination without abnormal findings: Secondary | ICD-10-CM | POA: Diagnosis not present

## 2024-07-10 ENCOUNTER — Encounter

## 2024-07-10 DIAGNOSIS — Z1231 Encounter for screening mammogram for malignant neoplasm of breast: Secondary | ICD-10-CM
